# Patient Record
Sex: Female | Born: 1995 | Hispanic: Yes | Marital: Married | Smoking: Never smoker
Health system: Southern US, Community
[De-identification: ages and names within clinical notes are randomized; demographics above are authoritative.]

## PROBLEM LIST (undated history)

## (undated) DIAGNOSIS — Z789 Other specified health status: Secondary | ICD-10-CM

## (undated) HISTORY — PX: NO PAST SURGERIES: SHX2092

---

## 2018-04-29 NOTE — L&D Delivery Note (Signed)
Delivery Note  First Stage: Labor onset: 1939 Induction: Cytotec x 4doses, Cook cath, Pitocin Analgesia /Anesthesia intrapartum: epidural SROM at 1939  Second Stage: Complete dilation at 0140 Onset of pushing at 0148 FHR second stage Cat II- variable decels with terminal bradycardia x 2.23min to 65bpm.   Delivery of a viable female infant on 11/25/18 at [facility] by CNM.  delivery of fetal head in OA position with restitution to LOA. No nuchal cord;  Anterior then posterior shoulders delivered easily with gentle downward traction and tight body cord noted after delivery of posterior shoulder and infant chest. Baby placed on mom's chest, but not vigorous after 30sec stim, cord double clamped and cut by FOB. Infant moved to warmer for attendance by nursery team for transition.  Arterial cord blood sample collection attempted, venous sample obtained. Cord blood collected due to maternal blood type O Pos.   Third Stage: Placenta delivered spontaneously intact with 3VC @ 0204 Placenta disposition: to pathology due to suspected FGR, CHTN on medications. Intact with adherent clots and calicifications noted.  Uterine tone Firm / bleeding scant  No cervical, vaginal or perineal lacerations identified  Anesthesia for repair: epidural Repair n/a Est. Blood Loss (mL): 465KC  Complications: none  Mom to postpartum.  Baby to Couplet care / Skin to Skin.  Newborn: Birth Weight: 6#7  Apgar Scores: 7/9 Feeding planned: breast

## 2018-07-10 DIAGNOSIS — O10919 Unspecified pre-existing hypertension complicating pregnancy, unspecified trimester: Secondary | ICD-10-CM | POA: Diagnosis not present

## 2018-07-10 DIAGNOSIS — Z6841 Body Mass Index (BMI) 40.0 and over, adult: Secondary | ICD-10-CM | POA: Diagnosis not present

## 2018-07-10 DIAGNOSIS — O0992 Supervision of high risk pregnancy, unspecified, second trimester: Secondary | ICD-10-CM | POA: Diagnosis not present

## 2018-07-10 LAB — OB RESULTS CONSOLE HEPATITIS B SURFACE ANTIGEN: Hepatitis B Surface Ag: NEGATIVE

## 2018-07-10 LAB — OB RESULTS CONSOLE RUBELLA ANTIBODY, IGM: Rubella: IMMUNE

## 2018-07-10 LAB — OB RESULTS CONSOLE HIV ANTIBODY (ROUTINE TESTING): HIV: NONREACTIVE

## 2018-07-10 LAB — OB RESULTS CONSOLE RPR: RPR: NONREACTIVE

## 2018-07-10 LAB — OB RESULTS CONSOLE VARICELLA ZOSTER ANTIBODY, IGG: Varicella: IMMUNE

## 2018-07-13 DIAGNOSIS — Z6841 Body Mass Index (BMI) 40.0 and over, adult: Secondary | ICD-10-CM | POA: Diagnosis not present

## 2018-07-13 DIAGNOSIS — O0992 Supervision of high risk pregnancy, unspecified, second trimester: Secondary | ICD-10-CM | POA: Diagnosis not present

## 2018-08-11 DIAGNOSIS — Z113 Encounter for screening for infections with a predominantly sexual mode of transmission: Secondary | ICD-10-CM | POA: Diagnosis not present

## 2018-08-11 DIAGNOSIS — Z8619 Personal history of other infectious and parasitic diseases: Secondary | ICD-10-CM | POA: Diagnosis not present

## 2018-08-12 DIAGNOSIS — O0992 Supervision of high risk pregnancy, unspecified, second trimester: Secondary | ICD-10-CM | POA: Diagnosis not present

## 2018-09-17 DIAGNOSIS — O0992 Supervision of high risk pregnancy, unspecified, second trimester: Secondary | ICD-10-CM | POA: Diagnosis not present

## 2018-09-17 DIAGNOSIS — O10912 Unspecified pre-existing hypertension complicating pregnancy, second trimester: Secondary | ICD-10-CM | POA: Diagnosis not present

## 2018-10-05 DIAGNOSIS — O0993 Supervision of high risk pregnancy, unspecified, third trimester: Secondary | ICD-10-CM | POA: Diagnosis not present

## 2018-10-05 DIAGNOSIS — O365930 Maternal care for other known or suspected poor fetal growth, third trimester, not applicable or unspecified: Secondary | ICD-10-CM | POA: Diagnosis not present

## 2018-10-05 DIAGNOSIS — Z23 Encounter for immunization: Secondary | ICD-10-CM | POA: Diagnosis not present

## 2018-10-05 DIAGNOSIS — O10913 Unspecified pre-existing hypertension complicating pregnancy, third trimester: Secondary | ICD-10-CM | POA: Diagnosis not present

## 2018-10-07 ENCOUNTER — Other Ambulatory Visit: Payer: Self-pay | Admitting: Obstetrics & Gynecology

## 2018-10-07 DIAGNOSIS — O365931 Maternal care for other known or suspected poor fetal growth, third trimester, fetus 1: Secondary | ICD-10-CM

## 2018-10-08 ENCOUNTER — Other Ambulatory Visit: Payer: Self-pay

## 2018-10-08 DIAGNOSIS — O365990 Maternal care for other known or suspected poor fetal growth, unspecified trimester, not applicable or unspecified: Secondary | ICD-10-CM

## 2018-10-12 ENCOUNTER — Ambulatory Visit
Admission: RE | Admit: 2018-10-12 | Discharge: 2018-10-12 | Disposition: A | Discharge status: Home / Self Care | Payer: Medicaid Other | Source: Ambulatory Visit | Attending: Maternal & Fetal Medicine | Admitting: Maternal & Fetal Medicine

## 2018-10-12 ENCOUNTER — Other Ambulatory Visit: Payer: Self-pay | Admitting: Maternal & Fetal Medicine

## 2018-10-12 ENCOUNTER — Other Ambulatory Visit: Payer: Self-pay

## 2018-10-12 DIAGNOSIS — Z79899 Other long term (current) drug therapy: Secondary | ICD-10-CM | POA: Diagnosis not present

## 2018-10-12 DIAGNOSIS — O365990 Maternal care for other known or suspected poor fetal growth, unspecified trimester, not applicable or unspecified: Secondary | ICD-10-CM

## 2018-10-12 DIAGNOSIS — O10913 Unspecified pre-existing hypertension complicating pregnancy, third trimester: Secondary | ICD-10-CM | POA: Diagnosis not present

## 2018-10-12 DIAGNOSIS — O321XX0 Maternal care for breech presentation, not applicable or unspecified: Secondary | ICD-10-CM | POA: Diagnosis not present

## 2018-10-12 DIAGNOSIS — O365930 Maternal care for other known or suspected poor fetal growth, third trimester, not applicable or unspecified: Secondary | ICD-10-CM | POA: Diagnosis not present

## 2018-10-12 DIAGNOSIS — Z3A31 31 weeks gestation of pregnancy: Secondary | ICD-10-CM | POA: Insufficient documentation

## 2018-10-15 ENCOUNTER — Other Ambulatory Visit: Payer: Self-pay

## 2018-10-15 DIAGNOSIS — O365990 Maternal care for other known or suspected poor fetal growth, unspecified trimester, not applicable or unspecified: Secondary | ICD-10-CM

## 2018-10-19 ENCOUNTER — Other Ambulatory Visit: Payer: Self-pay

## 2018-10-19 ENCOUNTER — Ambulatory Visit
Admission: RE | Admit: 2018-10-19 | Discharge: 2018-10-19 | Disposition: A | Discharge status: Home / Self Care | Payer: Medicaid Other | Source: Ambulatory Visit | Attending: Obstetrics and Gynecology | Admitting: Obstetrics and Gynecology

## 2018-10-19 DIAGNOSIS — Z3A32 32 weeks gestation of pregnancy: Secondary | ICD-10-CM | POA: Diagnosis not present

## 2018-10-19 DIAGNOSIS — O365930 Maternal care for other known or suspected poor fetal growth, third trimester, not applicable or unspecified: Secondary | ICD-10-CM | POA: Insufficient documentation

## 2018-10-19 DIAGNOSIS — O321XX0 Maternal care for breech presentation, not applicable or unspecified: Secondary | ICD-10-CM | POA: Diagnosis not present

## 2018-10-19 DIAGNOSIS — O365990 Maternal care for other known or suspected poor fetal growth, unspecified trimester, not applicable or unspecified: Secondary | ICD-10-CM

## 2018-10-22 ENCOUNTER — Other Ambulatory Visit: Payer: Self-pay

## 2018-10-22 ENCOUNTER — Ambulatory Visit: Payer: Medicaid Other

## 2018-10-22 DIAGNOSIS — O365990 Maternal care for other known or suspected poor fetal growth, unspecified trimester, not applicable or unspecified: Secondary | ICD-10-CM

## 2018-10-26 ENCOUNTER — Other Ambulatory Visit: Payer: Self-pay

## 2018-10-26 ENCOUNTER — Ambulatory Visit
Admission: RE | Admit: 2018-10-26 | Discharge: 2018-10-26 | Disposition: A | Discharge status: Home / Self Care | Payer: Medicaid Other | Source: Ambulatory Visit | Attending: Obstetrics and Gynecology | Admitting: Obstetrics and Gynecology

## 2018-10-26 DIAGNOSIS — O365990 Maternal care for other known or suspected poor fetal growth, unspecified trimester, not applicable or unspecified: Secondary | ICD-10-CM

## 2018-10-26 DIAGNOSIS — O321XX0 Maternal care for breech presentation, not applicable or unspecified: Secondary | ICD-10-CM

## 2018-10-26 DIAGNOSIS — O365930 Maternal care for other known or suspected poor fetal growth, third trimester, not applicable or unspecified: Secondary | ICD-10-CM | POA: Insufficient documentation

## 2018-10-26 DIAGNOSIS — O169 Unspecified maternal hypertension, unspecified trimester: Secondary | ICD-10-CM

## 2018-10-26 DIAGNOSIS — Z3A33 33 weeks gestation of pregnancy: Secondary | ICD-10-CM | POA: Insufficient documentation

## 2018-10-26 DIAGNOSIS — O163 Unspecified maternal hypertension, third trimester: Secondary | ICD-10-CM | POA: Insufficient documentation

## 2018-10-26 DIAGNOSIS — O10913 Unspecified pre-existing hypertension complicating pregnancy, third trimester: Secondary | ICD-10-CM | POA: Diagnosis not present

## 2018-10-26 DIAGNOSIS — O0993 Supervision of high risk pregnancy, unspecified, third trimester: Secondary | ICD-10-CM | POA: Diagnosis not present

## 2018-10-26 DIAGNOSIS — O99210 Obesity complicating pregnancy, unspecified trimester: Secondary | ICD-10-CM | POA: Insufficient documentation

## 2018-10-26 DIAGNOSIS — O10019 Pre-existing essential hypertension complicating pregnancy, unspecified trimester: Secondary | ICD-10-CM

## 2018-10-26 HISTORY — DX: Maternal care for other known or suspected poor fetal growth, unspecified trimester, not applicable or unspecified: O36.5990

## 2018-10-26 HISTORY — DX: Maternal care for breech presentation, not applicable or unspecified: O32.1XX0

## 2018-10-26 HISTORY — DX: Unspecified maternal hypertension, unspecified trimester: O16.9

## 2018-10-26 NOTE — Progress Notes (Signed)
Patient is a 23 year old G1, P0 white female with hypertension on meds labetalol 400 twice daily, obesity BMI 45, fetal growth restriction 6 percentile BPP today was 8 out of 8 Dopplers were normal range fluid was normal fetus was breech Blood pressure at presentation was 143/90 patient reported she had just taken her blood pressure medicine on the way to the clinic Repeat after her scan was 128/80 using a large cuff (maroon) I advised that the patient return weekly for BPP Dopplers and fluid check At 36 weeks consider going to twice weekly given her hypertension on meds (this could be an additional NST or another BPP) Previously I recommended consideration of delivery at 39 weeks given the fetus was greater than 5th percentile but adding in hypertension on meds consideration could be given to moving the delivery earlier. Patient was scheduled for follow-up appointment in 1 week. We will continue to do every 3 week growth scans.  Sandy Sparks

## 2018-10-29 ENCOUNTER — Other Ambulatory Visit: Payer: Self-pay

## 2018-10-29 DIAGNOSIS — O0993 Supervision of high risk pregnancy, unspecified, third trimester: Secondary | ICD-10-CM | POA: Diagnosis not present

## 2018-10-29 DIAGNOSIS — O10913 Unspecified pre-existing hypertension complicating pregnancy, third trimester: Secondary | ICD-10-CM | POA: Diagnosis not present

## 2018-10-29 DIAGNOSIS — O365990 Maternal care for other known or suspected poor fetal growth, unspecified trimester, not applicable or unspecified: Secondary | ICD-10-CM

## 2018-11-02 ENCOUNTER — Observation Stay
Admission: RE | Admit: 2018-11-02 | Discharge: 2018-11-02 | Disposition: A | Discharge status: Home / Self Care | Payer: Medicaid Other | Attending: Obstetrics and Gynecology | Admitting: Obstetrics and Gynecology

## 2018-11-02 ENCOUNTER — Ambulatory Visit
Admission: RE | Admit: 2018-11-02 | Discharge: 2018-11-02 | Disposition: A | Discharge status: Home / Self Care | Payer: Medicaid Other | Source: Ambulatory Visit | Attending: Obstetrics and Gynecology | Admitting: Obstetrics and Gynecology

## 2018-11-02 ENCOUNTER — Ambulatory Visit
Admission: RE | Admit: 2018-11-02 | Discharge: 2018-11-02 | Disposition: A | Discharge status: Home / Self Care | Payer: Medicaid Other | Source: Ambulatory Visit

## 2018-11-02 ENCOUNTER — Other Ambulatory Visit: Payer: Self-pay

## 2018-11-02 DIAGNOSIS — Z3A33 33 weeks gestation of pregnancy: Secondary | ICD-10-CM | POA: Diagnosis not present

## 2018-11-02 DIAGNOSIS — O10013 Pre-existing essential hypertension complicating pregnancy, third trimester: Secondary | ICD-10-CM | POA: Diagnosis not present

## 2018-11-02 DIAGNOSIS — O365930 Maternal care for other known or suspected poor fetal growth, third trimester, not applicable or unspecified: Secondary | ICD-10-CM | POA: Diagnosis not present

## 2018-11-02 DIAGNOSIS — G5600 Carpal tunnel syndrome, unspecified upper limb: Secondary | ICD-10-CM | POA: Insufficient documentation

## 2018-11-02 DIAGNOSIS — O10913 Unspecified pre-existing hypertension complicating pregnancy, third trimester: Secondary | ICD-10-CM | POA: Diagnosis not present

## 2018-11-02 DIAGNOSIS — O368330 Maternal care for abnormalities of the fetal heart rate or rhythm, third trimester, not applicable or unspecified: Secondary | ICD-10-CM | POA: Diagnosis not present

## 2018-11-02 DIAGNOSIS — G5603 Carpal tunnel syndrome, bilateral upper limbs: Secondary | ICD-10-CM

## 2018-11-02 DIAGNOSIS — O365990 Maternal care for other known or suspected poor fetal growth, unspecified trimester, not applicable or unspecified: Secondary | ICD-10-CM

## 2018-11-02 DIAGNOSIS — O321XX0 Maternal care for breech presentation, not applicable or unspecified: Secondary | ICD-10-CM

## 2018-11-02 HISTORY — DX: Other specified health status: Z78.9

## 2018-11-02 NOTE — OB Triage Note (Signed)
Provider reviewed strip. D/c instructions reviewed with patient and patient verbalized understanding of instructions. Pt has no further questions at this time and discharged in stable condition.

## 2018-11-02 NOTE — Progress Notes (Signed)
   Triage visit for NST   Sandy Sparks is a 23 y.o. G1P0. She is at [redacted]w[redacted]d gestation. She presents for a unscheduled NST, sent up from [facility] for monitoring. Hx of HTN on labetalol 400mg  BID and fetal growth restriction 6%.  Indication: BPP 8/8 but non-reactive NST  O:  BP 121/68   Pulse 95   Temp 99.1 F (37.3 C) (Oral)   Resp 18   Ht 5\' 6"  (1.676 m)   Wt 128.8 kg   LMP 03/06/2018   BMI 45.84 kg/m  No results found for this or any previous visit (from the past 48 hour(s)).   Gen: NAD, AAOx3      Abd: FNTTP      Ext: Non-tender, Nonedmeatous    NST  Baseline: 160 Variability: moderate Accelerations present x >2 Decelerations absent Time 80mins  Interpretation: reactive NST, category 1 tracing  A/P:  23 y.o. G1P0 [redacted]w[redacted]d with HTN.    Reactive NST, with moderate variability and accelerations, no decels  Fetal Wellbeing: Reassuring  D/c home stable, precautions reviewed, follow-up as scheduled.     ----- Benjaman Kindler, MD MPH Attending Obstetrician and Gynecologist [facility], Department of [facility]

## 2018-11-02 NOTE — OB Triage Note (Signed)
Pt brought up from [facility] for NST due to non reactive NST downstairs. Monitors applied and initial FHT 160.

## 2018-11-02 NOTE — Progress Notes (Signed)
Pt had [place] at [facility] today for Growth,  BPP and Dopplers.  NST performed after because FHT 160's-170's. NST Non-Reactive.  Dr. Lehman Prom wanted pt brought up to [facility] #3 for monitoring.  2 BP's taken in [facility]-157/99 on arrival and 135/76 during [place].  Pt took BP meds at 1030 today.Report Given to Assunta Curtis, Therapist, sports.

## 2018-11-02 NOTE — Discharge Summary (Addendum)
Discharged from triage. 

## 2018-11-05 DIAGNOSIS — O10913 Unspecified pre-existing hypertension complicating pregnancy, third trimester: Secondary | ICD-10-CM | POA: Diagnosis not present

## 2018-11-05 DIAGNOSIS — O365930 Maternal care for other known or suspected poor fetal growth, third trimester, not applicable or unspecified: Secondary | ICD-10-CM | POA: Diagnosis not present

## 2018-11-05 DIAGNOSIS — O0993 Supervision of high risk pregnancy, unspecified, third trimester: Secondary | ICD-10-CM | POA: Diagnosis not present

## 2018-11-09 ENCOUNTER — Ambulatory Visit
Admission: RE | Admit: 2018-11-09 | Discharge: 2018-11-09 | Disposition: A | Discharge status: Home / Self Care | Payer: Medicaid Other | Source: Ambulatory Visit | Attending: Maternal & Fetal Medicine | Admitting: Maternal & Fetal Medicine

## 2018-11-09 ENCOUNTER — Other Ambulatory Visit: Payer: Self-pay | Admitting: Maternal & Fetal Medicine

## 2018-11-09 ENCOUNTER — Other Ambulatory Visit: Payer: Self-pay

## 2018-11-09 DIAGNOSIS — O365990 Maternal care for other known or suspected poor fetal growth, unspecified trimester, not applicable or unspecified: Secondary | ICD-10-CM

## 2018-11-09 DIAGNOSIS — Z3A35 35 weeks gestation of pregnancy: Secondary | ICD-10-CM | POA: Diagnosis not present

## 2018-11-09 DIAGNOSIS — O365930 Maternal care for other known or suspected poor fetal growth, third trimester, not applicable or unspecified: Secondary | ICD-10-CM | POA: Insufficient documentation

## 2018-11-12 ENCOUNTER — Other Ambulatory Visit: Payer: Self-pay | Admitting: Maternal & Fetal Medicine

## 2018-11-12 DIAGNOSIS — O163 Unspecified maternal hypertension, third trimester: Secondary | ICD-10-CM

## 2018-11-13 DIAGNOSIS — O0993 Supervision of high risk pregnancy, unspecified, third trimester: Secondary | ICD-10-CM | POA: Diagnosis not present

## 2018-11-13 DIAGNOSIS — O321XX0 Maternal care for breech presentation, not applicable or unspecified: Secondary | ICD-10-CM | POA: Diagnosis not present

## 2018-11-13 DIAGNOSIS — O365930 Maternal care for other known or suspected poor fetal growth, third trimester, not applicable or unspecified: Secondary | ICD-10-CM | POA: Diagnosis not present

## 2018-11-13 LAB — OB RESULTS CONSOLE GC/CHLAMYDIA
Chlamydia: NEGATIVE
Gonorrhea: NEGATIVE

## 2018-11-13 LAB — OB RESULTS CONSOLE GBS: GBS: NEGATIVE

## 2018-11-16 ENCOUNTER — Ambulatory Visit
Admission: RE | Admit: 2018-11-16 | Discharge: 2018-11-16 | Disposition: A | Discharge status: Home / Self Care | Payer: Medicaid Other | Source: Ambulatory Visit | Attending: Maternal & Fetal Medicine | Admitting: Maternal & Fetal Medicine

## 2018-11-16 DIAGNOSIS — O163 Unspecified maternal hypertension, third trimester: Secondary | ICD-10-CM | POA: Diagnosis not present

## 2018-11-16 DIAGNOSIS — Z3A36 36 weeks gestation of pregnancy: Secondary | ICD-10-CM | POA: Insufficient documentation

## 2018-11-18 ENCOUNTER — Ambulatory Visit
Admission: RE | Admit: 2018-11-18 | Discharge: 2018-11-18 | Disposition: A | Discharge status: Home / Self Care | Payer: Medicaid Other | Source: Ambulatory Visit | Attending: Obstetrics and Gynecology | Admitting: Obstetrics and Gynecology

## 2018-11-18 ENCOUNTER — Other Ambulatory Visit: Payer: Self-pay

## 2018-11-18 DIAGNOSIS — Z1159 Encounter for screening for other viral diseases: Secondary | ICD-10-CM | POA: Diagnosis not present

## 2018-11-18 LAB — SARS CORONAVIRUS 2 (TAT 6-24 HRS): SARS Coronavirus 2: NEGATIVE

## 2018-11-20 ENCOUNTER — Inpatient Hospital Stay
Admission: EM | Admit: 2018-11-20 | Discharge: 2018-11-20 | DRG: 832 | Disposition: A | Discharge status: Home / Self Care | Payer: Medicaid Other | Attending: Obstetrics and Gynecology | Admitting: Obstetrics and Gynecology

## 2018-11-20 ENCOUNTER — Other Ambulatory Visit: Payer: Self-pay

## 2018-11-20 DIAGNOSIS — O321XX0 Maternal care for breech presentation, not applicable or unspecified: Secondary | ICD-10-CM | POA: Diagnosis present

## 2018-11-20 DIAGNOSIS — O365930 Maternal care for other known or suspected poor fetal growth, third trimester, not applicable or unspecified: Secondary | ICD-10-CM | POA: Diagnosis not present

## 2018-11-20 DIAGNOSIS — D649 Anemia, unspecified: Secondary | ICD-10-CM | POA: Diagnosis present

## 2018-11-20 DIAGNOSIS — O368330 Maternal care for abnormalities of the fetal heart rate or rhythm, third trimester, not applicable or unspecified: Secondary | ICD-10-CM | POA: Diagnosis not present

## 2018-11-20 DIAGNOSIS — O0993 Supervision of high risk pregnancy, unspecified, third trimester: Secondary | ICD-10-CM | POA: Diagnosis not present

## 2018-11-20 DIAGNOSIS — O365990 Maternal care for other known or suspected poor fetal growth, unspecified trimester, not applicable or unspecified: Secondary | ICD-10-CM | POA: Diagnosis present

## 2018-11-20 DIAGNOSIS — O10913 Unspecified pre-existing hypertension complicating pregnancy, third trimester: Secondary | ICD-10-CM | POA: Diagnosis not present

## 2018-11-20 DIAGNOSIS — O99013 Anemia complicating pregnancy, third trimester: Secondary | ICD-10-CM | POA: Diagnosis present

## 2018-11-20 DIAGNOSIS — Z3A37 37 weeks gestation of pregnancy: Secondary | ICD-10-CM

## 2018-11-20 DIAGNOSIS — O10919 Unspecified pre-existing hypertension complicating pregnancy, unspecified trimester: Secondary | ICD-10-CM

## 2018-11-20 DIAGNOSIS — O10013 Pre-existing essential hypertension complicating pregnancy, third trimester: Secondary | ICD-10-CM | POA: Diagnosis present

## 2018-11-20 LAB — CBC
HCT: 33.7 % — ABNORMAL LOW (ref 36.0–46.0)
Hemoglobin: 11.2 g/dL — ABNORMAL LOW (ref 12.0–15.0)
MCH: 27.5 pg (ref 26.0–34.0)
MCHC: 33.2 g/dL (ref 30.0–36.0)
MCV: 82.6 fL (ref 80.0–100.0)
Platelets: 319 10*3/uL (ref 150–400)
RBC: 4.08 MIL/uL (ref 3.87–5.11)
RDW: 14.2 % (ref 11.5–15.5)
WBC: 10.8 10*3/uL — ABNORMAL HIGH (ref 4.0–10.5)
nRBC: 0.2 % (ref 0.0–0.2)

## 2018-11-20 LAB — BASIC METABOLIC PANEL
Anion gap: 8 (ref 5–15)
BUN: 6 mg/dL (ref 6–20)
CO2: 20 mmol/L — ABNORMAL LOW (ref 22–32)
Calcium: 9.2 mg/dL (ref 8.9–10.3)
Chloride: 107 mmol/L (ref 98–111)
Creatinine, Ser: 0.48 mg/dL (ref 0.44–1.00)
GFR calc Af Amer: >60 mL/min (ref >60)
GFR calc non Af Amer: >60 mL/min (ref >60)
Glucose, Bld: 93 mg/dL (ref 70–99)
Potassium: 4 mmol/L (ref 3.5–5.1)
Sodium: 135 mmol/L (ref 135–145)

## 2018-11-20 LAB — TYPE AND SCREEN
ABO/RH(D): O POS
Antibody Screen: NEGATIVE

## 2018-11-20 LAB — ABO/RH: ABO/RH(D): O POS

## 2018-11-20 MED ORDER — OXYTOCIN 40 UNITS IN NORMAL SALINE INFUSION - SIMPLE MED
INTRAVENOUS | Status: AC
  Filled 2018-11-20: qty 1000

## 2018-11-20 MED ORDER — LACTATED RINGERS IV SOLN
INTRAVENOUS | Status: DC
  Administered 2018-11-20: 13:00:00 via INTRAVENOUS

## 2018-11-20 MED ORDER — SOD CITRATE-CITRIC ACID 500-334 MG/5ML PO SOLN
ORAL | Status: AC
  Filled 2018-11-20: qty 30

## 2018-11-20 NOTE — H&P (Signed)
OB ADMISSION/ HISTORY & PHYSICAL:  Admission Date: 11/20/2018 10:11 AM  Admit Diagnosis: Fetal tachycardia  Virginia Curl is a 23 y.o. G1P0 at 37+0 wks (by LMP of 03/06/18 and confirmed with her first ultrasound at 17wks5days) presenting for fetal tachycardia in the office of 170s and confirmed in triage.  She has chronic HTN with severe range BP at initial OB visit, is on labetalol, with fetal growth restriction and normal Dopplers, and breech presentation.  Prenatal History: G1P0   EDC : 12/17/2018, by Patient Reported  Prenatal care at [facility] Prenatal course complicated by  - breech -cHTN, on ASA, labetalol 400mg  BID -FGR with EFW 6% and AC of 3%, normal Doppler's with MFM -Anemia -Prepregnancy BMI 41  BREECH persistent since 21wks--counseled ECV vs LTSC 1. Late to care at 18w due to insurance 2. Chronic hypertension  BPs 174/92 and 162/86 at initial OB visit  [x]  Baseline labs (CBC, CMP, urine Pr:Cr): ordered 07/10/2018  [x]  Baby ASA prescribed after 12 weeks: advised to start 07/10/2018, rx sent to pharmacy  [ ]  Serial growth scans -35-38  [ ]  Antenatal testing starting at 32 weeks  MFM 6/15: Rec weekly BPP and UA dopplers for IUGR 6%, repeat growth 3 weeks, delivery at 39 weeks    [ ]  Delivery by 39 weeks (with meds 37wks if not controlled, 40 with no meds) or earlier if needed  Labetalol 200mg  BID started 07/10/2018 per CCW  Increased to 400mg  BID on 08/11/2018 per BEB 3. Anemia,   initial Hgb 10.6 at NOB, to start on iron with Vit C.  4. Prepregnancy BMI 41  Advised TWG 11-20 pounds  CMP-wnl, P/C ratio: 60, and a1c: 5.7 07/10/2018  Early GTT: 94 5. Fetal Growth Restriction: EFW 6% at MFM on 6/15  Weekly Dopplers and BPP at MFM  Weekly NST  Repeat growth at MFM-7/6: EFW 8% with AC 3%, rec. delivery 37-38wks  Patient requests aversion  6. Chlamydia POSITIVE at NOB  Treated 03/19 ET  TOC at next visit 04/14 with [facility]  TOC Results: Negative   Medical /  Surgical History :  Past medical history:  Past Medical History:  Diagnosis Date  . Medical history non-contributory      Past surgical history:  Past Surgical History:  Procedure Laterality Date  . NO PAST SURGERIES      Family History:  Family History  Problem Relation Age of Onset  . Aneurysm Mother        Brain  . Heart murmur Brother   . Aneurysm Maternal Grandmother   . Stroke Maternal Grandmother   . Diabetes Paternal Grandmother      Social History:  reports that she has never smoked. She has never used smokeless tobacco. She reports that she does not drink alcohol or use drugs.   Allergies: Patient has no known allergies.    Current Medications at time of admission:  Prior to Admission medications   Medication Sig Start Date End Date Taking? Authorizing Provider  aspirin EC 81 MG tablet Take 81 mg by mouth daily.   Yes [provider]  ferrous sulfate 325 (65 FE) MG tablet Take 650 mg by mouth every evening.    Yes [provider]  labetalol (NORMODYNE) 200 MG tablet Take 400 mg by mouth 2 (two) times daily.    Yes [provider]  Prenatal Vit-Fe Fumarate-FA (PRENATAL MULTIVITAMIN) TABS tablet Take 1 tablet by mouth every evening.    Yes [provider]  vitamin C (  ASCORBIC ACID) 250 MG tablet Take 250 mg by mouth every evening.    Yes [provider]     Review of Systems: Active FM No HA, edema, RUQ pain or visual changes   Physical Exam:  VS: Blood pressure (!) 143/78, pulse 97, temperature 98.6 F (37 C), temperature source Oral, resp. rate 17, height 5\' 6"  (1.676 m), weight 132.5 kg, last menstrual period 03/06/2018.  General: alert and oriented, appears NAD Heart: RRR Lungs: Clear lung fields Abdomen: Gravid, soft and non-tender, non-distended Extremities: trace edema  FHT: 170, moderate variability, +accels, no decels TOCO: q10 min SVE:  deferred   Cephalic by leopolds  Prenatal Labs: Blood  type/Rh  O  Antibody screen neg  Rubella Immune  Varicella Immune  RPR NR  HBsAg Neg  HIV NR  GC neg  Chlamydia neg  Genetic screening negative  1 hour GTT 102  3 hour GTT n/a  GBS neg   [place] Mfm Fetal Bpp W/nonstress  Result Date: 11/09/2018 ----------------------------------------------------------------------  OBSTETRICS REPORT                       (Signed Final 11/02/2018 12:37 pm) ---------------------------------------------------------------------- PATIENT INFO:  ID #:       [id number]                          D.O.B.:  02-26-1996 (23 yrs)  Name:       Lorenda PeckKATHERINE Trupiano                 Visit Date: 11/02/2018 11:55 am ---------------------------------------------------------------------- PERFORMED BY:  Performed By:     Verne Grainonna Moody            Ref. Address:     [address]                    Sonographer                                                             [address], [place],                                                             [place] [id number]  Referred By:      Leola BrazilHELSEA C                    WARD MD ---------------------------------------------------------------------- SERVICE(S) PROVIDED:   US MFM OB FOLLOW UP                                  [phone]  ---------------------------------------------------------------------- INDICATIONS:   [redacted] weeks gestation of pregnancy                Z3A.34  ---------------------------------------------------------------------- FETAL EVALUATION:  Num Of Fetuses:         1  Fetal Heart Rate(bpm):  169  Cardiac Activity:       Present  Fetal Lie:  Maternal Right  Presentation:           Breech  Placenta:               Posterior right, fundal  Amniotic Fluid  AFI FV:      Within normal limits  AFI Sum(cm)     %Tile       Largest Pocket(cm)  14.87           53          5.6  RUQ(cm)       RLQ(cm)       LUQ(cm)        LLQ(cm)  1.82          3.25          4.2            5.6 ---------------------------------------------------------------------- BIOMETRY:   BPD:      77.4  mm     G. Age:  31w 0d        < 1  %    CI:        69.25   %    70 - 86                                                          FL/HC:      22.3   %    19.4 - 21.8  HC:       297   mm     G. Age:  32w 6d        < 3  %    HC/AC:      1.07        0.96 - 1.11  AC:      278.1  mm     G. Age:  31w 6d          3  %    FL/BPD:     85.5   %    71 - 87  FL:       66.2  mm     G. Age:  34w 1d         32  %    FL/AC:      23.8   %    20 - 24  Est. FW:    2002  gm      4 lb 7 oz      8  % ---------------------------------------------------------------------- OB HISTORY:  Gravidity:    1 ---------------------------------------------------------------------- GESTATIONAL AGE:  LMP:           34w 3d        Date:  03/06/18                 EDD:   12/11/18  U/S Today:     32w 3d                                        EDD:   12/25/18  Best:          34w 3d     Det. By:  LMP  (03/06/18)          EDD:   12/11/18 ---------------------------------------------------------------------- ANATOMY:  Cranium:  Within Normal Limits   Diaphragm:              Within Normal Limits  Cavum:                 Normal appearance      Stomach:                Seen  Ventricles:            Normal appearance      Kidneys:                Normal appearance  Thoracic:              Normal appearance      Bladder:                Seen  Heart:                 Normal 4 chamber       Spine:                  Normal appearance                         view ---------------------------------------------------------------------- DOPPLER - FETAL VESSELS:  Umbilical Artery   S/D     %tile   2.4       44 ---------------------------------------------------------------------- IMPRESSION:  ,  Thank you for referring your patient for a fetal growth  evaluation for FGR and Hypertension .  There is a singleton gestation with normal amniotic fluid  volume.  Fetus is BREECH  The pt is at 2234 w 3d by her LMP and [facility]  on 17w 5d on  07/14/2018.  The estimated fetal weight is at the 8th   percentile.  (  previously at the 6th percentile)  However, the abdominal circumference is less than the 3rd  percentile.  This finding could be a sign of intrauterine growth restriction  (IUGR).  It must be noted that estimated fetal weight by ultrasound and  actual birthweight can differ up to 15 percent.  The BPP was noted to be 8/8 which was reassuring.  FHR noted to be in the 160s and the patient was placed on  the monitor for a NST.  The umbilical artery Doppler studies were within normal limits  (S/D = 2.4)  Weekly testing (BPP and Doppler studies) is suggested.  Serial growth scans are suggested (approximately every 3  weeks).  Given her history of HTN on labetalol and FGR, Consider  early term delivery at 37-38 weeks .  No absolute contraindications to external cephalic version,  (normal fluid and posterior placenta ).  Her BP was initially elevated but improved after her  ultrasound.  Pt c/o hand pain consistent with carpal tunnel .  Thank you for allowing us to participate in your patient's care.  Please do not hesitate to contact us if we can be of further  assistance. ----------------------------------------------------------------------              Jimmey RalphElizabeth Livingston, MD Electronically Signed Final Report   11/02/2018 12:37 pm ----------------------------------------------------------------------  [place] Mfm Fetal Bpp Wo Non Stress  Result Date: 11/16/2018 ----------------------------------------------------------------------  OBSTETRICS REPORT                       (Signed Final 11/16/2018 12:27 pm) ---------------------------------------------------------------------- PATIENT INFO:  ID #:       [id number]  D.O.B.:  April 02, 1996 (23 yrs)  Name:       COLEEN CARDIFF                 Visit Date: 11/16/2018 12:11 pm ---------------------------------------------------------------------- PERFORMED BY:  Performed By:     Ceasar Mons          Ref. Address:     [address]                                                             [place], [place],                                                             [place] [address]  Referred By:      Leola Brazil MD ---------------------------------------------------------------------- SERVICE(S) PROVIDED:   [place] MFM FETAL BPP WO NON STRESS                       [phone]  ---------------------------------------------------------------------- INDICATIONS:   [redacted] weeks gestation of pregnancy                Z3A.36  ---------------------------------------------------------------------- FETAL EVALUATION:  Num Of Fetuses:         1  Fetal Heart Rate(bpm):  168  Cardiac Activity:       Present  Presentation:           Breech  Placenta:               Fundal  AFI Sum(cm)     %Tile       Largest Pocket(cm)  14.41           53          4.35  RUQ(cm)       RLQ(cm)       LUQ(cm)        LLQ(cm)  3.59          4.35          3.56           2.91 ---------------------------------------------------------------------- BIOPHYSICAL EVALUATION:  Amniotic F.V:   Within normal limits       F. Tone:        Observed  F. Movement:    Observed                   Score:          8/8  F. Breathing:   Observed ---------------------------------------------------------------------- OB HISTORY:  Gravidity:    1 ---------------------------------------------------------------------- GESTATIONAL AGE:  LMP:           36w 3d        Date:  03/06/18                 EDD:   12/11/18  Best:          Stevie Kern 3d  Det. By:  LMP  (03/06/18)          EDD:   12/11/18 ---------------------------------------------------------------------- DOPPLER - FETAL VESSELS:  Umbilical Artery   S/D     %tile  2.08       31 ---------------------------------------------------------------------- CERVIX UTERUS ADNEXA:  Cervix  Suboptimal ---------------------------------------------------------------------- IMPRESSION:  ,   Thank you for referring your patient for fetal biophysical  profile (BPP) and umbilical artery Dopplers due to IUGR (8th  percentile) and hypertension.  There is a singleton, BREECH gestation with normal amniotic  fluid volume.  The BPP was noted to be 8/8, which was reassuring.  The umbilical artery Doppler studies were within normal limits  for gestational age  She is scheduled for delivery next week.  Thank you for allowing [place] to participate in your patient's care.  Please do not hesitate to contact us if we can be of further  assistance. ----------------------------------------------------------------------                   Consuelo Pandy, MD Electronically Signed Final Report   11/16/2018 12:27 pm ----------------------------------------------------------------------  [place] Mfm Fetal Bpp Wo Non Stress  Result Date: 11/09/2018 ----------------------------------------------------------------------  OBSTETRICS REPORT                       (Signed Final 10/26/2018 10:48 am) ---------------------------------------------------------------------- PATIENT INFO:  ID #:       [id number]                          D.O.B.:  04/20/1996 (23 yrs)  Name:       Lorenda Peck                 Visit Date: 10/26/2018 10:07 am ---------------------------------------------------------------------- PERFORMED BY:  Performed By:     Ceasar Mons          Ref. Address:     [address]                                                             [place], [place],                                                             [place] [address]  Referred By:      Leola Brazil MD ---------------------------------------------------------------------- SERVICE(S) PROVIDED:   [place] MFM FETAL BPP WO NON STRESS                       [phone]  ---------------------------------------------------------------------- INDICATIONS:   [redacted] weeks gestation of pregnancy                Z3A.33   ---------------------------------------------------------------------- FETAL EVALUATION:  Num Of Fetuses:         1  Fetal Heart Rate(bpm):  168  Cardiac Activity:  Present  Presentation:           Breech  Placenta:               Posterior  AFI Sum(cm)     %Tile       Largest Pocket(cm)  12.97           40          3.44  RUQ(cm)       RLQ(cm)       LUQ(cm)        LLQ(cm)  3.44          3.23          3.29           3.01 ---------------------------------------------------------------------- BIOPHYSICAL EVALUATION:  Amniotic F.V:   Within normal limits       F. Tone:        Observed  F. Movement:    Observed                   Score:          8/8  F. Breathing:   Observed ---------------------------------------------------------------------- OB HISTORY:  Gravidity:    1 ---------------------------------------------------------------------- GESTATIONAL AGE:  LMP:           33w 3d        Date:  03/06/18                 EDD:   12/11/18  Best:          33w 3d     Det. By:  LMP  (03/06/18)          EDD:   12/11/18 ---------------------------------------------------------------------- ANATOMY:  Stomach:               Seen                   Bladder:                Seen ---------------------------------------------------------------------- DOPPLER - FETAL VESSELS:  Umbilical Artery   S/D     %tile     RI    %tile                     PSV                                                   (cm/s)   2.3       34   0.52        16                     51.7 ---------------------------------------------------------------------- CERVIX UTERUS ADNEXA:  Cervix  Length:            4.8  cm. ---------------------------------------------------------------------- IMPRESSION:  ,  Thank you for referring your patient  for fetal biophysical  profile (BPP) and umbilical artery Dopplers due to FGR (6th  %ile)and HTN on meds .Marland Kitchen  There is a singleton gestation with normal amniotic fluid  volume.  The pregnancy is at 33w 3d vased on LMP of 03/16/18  c/w  u/s performed at [facility] clinic on 07/14/18 at 17w 5d .  The BPP was noted to be 8/8, which was reassuring.  Fetus is BREECH  The umbilical artery Doppler studies were within normal limits  for  gestational age (S/D = 2.3 34 % ile )  Weekly testing (BPP and Doppler studies) is  suggested.Increase to twice weekly assessment with an  additional BPP or NST at 36 weeks.  Serial growth scans are suggested (approximately every 3  weeks).  OUr practice pattern is to deliver FGR fetuses >5th %ile at 39  weeks - given pt has HTN on meds could conisder moving  earlier .  Patient is on labetalol 400 bid initial BP was 143/90 pt  recently took meds - came down at end of visit using large  cuff 125/85 range  Thank you for allowing [place] to participate in your patient's care.  Please do not hesitate to contact us if we can be of further  assistance. ----------------------------------------------------------------------              Jimmey Ralph, MD Electronically Signed Final Report   10/26/2018 10:48 am ----------------------------------------------------------------------  [place] Mfm Ob Follow Up  Result Date: 11/09/2018 ----------------------------------------------------------------------  OBSTETRICS REPORT                       (Signed Final 11/02/2018 12:37 pm) ---------------------------------------------------------------------- PATIENT INFO:  ID #:       [id number]                          D.O.B.:  [birth year] (23 yrs)  Name:       ANICKA STUCKERT                 Visit Date: 11/02/2018 11:55 am ---------------------------------------------------------------------- PERFORMED BY:  Performed By:     Verne Grain            Ref. Address:     [address]                                                             [place], [place],                                                             [place] [address]  Referred By:      Leola Brazil MD  ---------------------------------------------------------------------- SERVICE(S) PROVIDED:   [place] MFM OB FOLLOW UP                                  [phone]  ---------------------------------------------------------------------- INDICATIONS:   [redacted] weeks gestation of pregnancy                Z3A.34  ---------------------------------------------------------------------- FETAL EVALUATION:  Num Of Fetuses:         1  Fetal Heart Rate(bpm):  169  Cardiac Activity:       Present  Fetal Lie:              Maternal Right  Presentation:           Breech  Placenta:               Posterior right, fundal  Amniotic Fluid  AFI FV:      Within normal limits  AFI Sum(cm)     %Tile       Largest Pocket(cm)  14.87           53          5.6  RUQ(cm)       RLQ(cm)       LUQ(cm)        LLQ(cm)  1.82          3.25          4.2            5.6 ---------------------------------------------------------------------- BIOMETRY:  BPD:      77.4  mm     G. Age:  31w 0d        < 1  %    CI:        69.25   %    70 - 86                                                          FL/HC:      22.3   %    19.4 - 21.8  HC:       297   mm     G. Age:  32w 6d        < 3  %    HC/AC:      1.07        0.96 - 1.11  AC:      278.1  mm     G. Age:  31w 6d          3  %    FL/BPD:     85.5   %    71 - 87  FL:       66.2  mm     G. Age:  34w 1d         32  %    FL/AC:      23.8   %    20 - 24  Est. FW:    2002  gm      4 lb 7 oz      8  % ---------------------------------------------------------------------- OB HISTORY:  Gravidity:    1 ---------------------------------------------------------------------- GESTATIONAL AGE:  LMP:           34w 3d        Date:  03/06/18                 EDD:   12/11/18  U/S Today:     32w 3d                                        EDD:   12/25/18  Best:          34w 3d     Det. By:  LMP  (03/06/18)          EDD:   12/11/18 ---------------------------------------------------------------------- ANATOMY:  Cranium:  Within Normal  Limits   Diaphragm:              Within Normal Limits  Cavum:                 Normal appearance      Stomach:                Seen  Ventricles:            Normal appearance      Kidneys:                Normal appearance  Thoracic:              Normal appearance      Bladder:                Seen  Heart:                 Normal 4 chamber       Spine:                  Normal appearance                         view ---------------------------------------------------------------------- DOPPLER - FETAL VESSELS:  Umbilical Artery   S/D     %tile   2.4       44 ---------------------------------------------------------------------- IMPRESSION:  ,  Thank you for referring your patient for a fetal growth  evaluation for FGR and Hypertension .  There is a singleton gestation with normal amniotic fluid  volume.  Fetus is BREECH  The pt is at 2634 w 3d by her LMP and [facility]  on 17w 5d on 07/14/2018.  The estimated fetal weight is at the 8th   percentile.  (  previously at the 6th percentile)  However, the abdominal circumference is less than the 3rd  percentile.  This finding could be a sign of intrauterine growth restriction  (IUGR).  It must be noted that estimated fetal weight by ultrasound and  actual birthweight can differ up to 15 percent.  The BPP was noted to be 8/8 which was reassuring.  FHR noted to be in the 160s and the patient was placed on  the monitor for a NST.  The umbilical artery Doppler studies were within normal limits  (S/D = 2.4)  Weekly testing (BPP and Doppler studies) is suggested.  Serial growth scans are suggested (approximately every 3  weeks).  Given her history of HTN on labetalol and FGR, Consider  early term delivery at 37-38 weeks .  No absolute contraindications to external cephalic version,  (normal fluid and posterior placenta ).  Her BP was initially elevated but improved after her  ultrasound.  Pt c/o hand pain consistent with carpal tunnel .  Thank you for allowing us to  participate in your patient's care.  Please do not hesitate to contact us if we can be of further  assistance. ----------------------------------------------------------------------              Jimmey RalphElizabeth Livingston, MD Electronically Signed Final Report   11/02/2018 12:37 pm ----------------------------------------------------------------------  [place] Mfm Ua Doppler Re-eval  Result Date: 11/09/2018 ----------------------------------------------------------------------  OBSTETRICS REPORT                       (Signed Final 11/09/2018 01:30 pm) ---------------------------------------------------------------------- PATIENT INFO:  ID #:       [id number]  D.O.B.:  10/30/1995 (23 yrs)  Name:       QUEENIE AUFIERO                 Visit Date: 11/09/2018 01:09 pm ---------------------------------------------------------------------- PERFORMED BY:  Performed By:     Neita Carp RDMS        Ref. Address:     [address], [place],                                                             [place] [address]  Referred By:      Leola Brazil MD ---------------------------------------------------------------------- SERVICE(S) PROVIDED:   [place] MFM UA DOPPLER RE-EVAL                            [phone]  ---------------------------------------------------------------------- INDICATIONS:   [redacted] weeks gestation of pregnancy                Z3A.35  ---------------------------------------------------------------------- FETAL EVALUATION:  Num Of Fetuses:         1  Fetal Heart Rate(bpm):  152  Cardiac Activity:       Present  Presentation:           Breech  Placenta:               Anterior Posterior right, fundal  AFI Sum(cm)     %Tile       Largest Pocket(cm)  16.42           60          6.86  RUQ(cm)       RLQ(cm)       LUQ(cm)        LLQ(cm)  4.83          3.04          1.69           6.86  ---------------------------------------------------------------------- BIOPHYSICAL EVALUATION:  Amniotic F.V:   Within normal limits       F. Tone:        Observed  F. Movement:    Observed                   Score:          8/8  F. Breathing:   Observed ---------------------------------------------------------------------- OB HISTORY:  Gravidity:    1 ---------------------------------------------------------------------- GESTATIONAL AGE:  LMP:           35w 3d        Date:  03/06/18                 EDD:   12/11/18  Best:          Consuello Closs 3d     Det. By:  LMP  (03/06/18)  EDD:   12/11/18 ---------------------------------------------------------------------- DOPPLER - FETAL VESSELS:  Umbilical Artery   S/D     %tile     RI    %tile                     PSV                                                   (cm/s)  2.96       75   0.66        81                     64.3 ---------------------------------------------------------------------- IMPRESSION:  Thank you for referring your patient for fetal biophysical  profile (BPP) and umbilical artery Dopplers due to fetal  growth restriction (efw at 8th percentile, however AC at 3rd  percentile, nl dopplers and AFI) due to matenral  hypertension.  BP today 136/77  There is a singleton gestation at 35 weeks 3 days with normal  amniotic fluid volume.  Dating is by LMP and earliest availabe  scan at [facility] on 07/14/18; measurements were consistent  with 17 weeks 5 days.  The fetus remains BREECH.  The BPP was noted to be 8/8, which was reassuring.  The umbilical artery Doppler studies were within normal limits  for gestational age.  Weekly testing (BPP and Doppler studies) is suggested.  Consider delivery 37-38 weeks due to HTN and FGR.  Thank you for allowing [place] to participate in your patient's care.  Please do not hesitate to contact us if we can be of further  assistance. ----------------------------------------------------------------------                   Consuelo Pandy, MD Electronically Signed Final Report   11/09/2018 01:30 pm ----------------------------------------------------------------------  [place] Mfm Ua Doppler Re-eval  Result Date: 11/09/2018 ----------------------------------------------------------------------  OBSTETRICS REPORT                       (Signed Final 11/02/2018 12:37 pm) ---------------------------------------------------------------------- PATIENT INFO:  ID #:       [id number]                          D.O.B.:  25-Mar-1996 (23 yrs)  Name:       AMBERLIE GAILLARD                 Visit Date: 11/02/2018 11:55 am ---------------------------------------------------------------------- PERFORMED BY:  Performed By:     Verne Grain            Ref. Address:     [address] [place],  [place] [address]  Referred By:      Leola Brazil MD ---------------------------------------------------------------------- SERVICE(S) PROVIDED:   [place] MFM OB FOLLOW UP                                  [phone]  ---------------------------------------------------------------------- INDICATIONS:   [redacted] weeks gestation of pregnancy                Z3A.34  ---------------------------------------------------------------------- FETAL EVALUATION:  Num Of Fetuses:         1  Fetal Heart Rate(bpm):  169  Cardiac Activity:       Present  Fetal Lie:              Maternal Right  Presentation:           Breech  Placenta:               Posterior right, fundal  Amniotic Fluid  AFI FV:      Within normal limits  AFI Sum(cm)     %Tile       Largest Pocket(cm)  14.87           53          5.6  RUQ(cm)       RLQ(cm)       LUQ(cm)        LLQ(cm)  1.82          3.25          4.2            5.6 ---------------------------------------------------------------------- BIOMETRY:  BPD:      77.4  mm     G. Age:  31w 0d        < 1  %    CI:         69.25   %    70 - 86                                                          FL/HC:      22.3   %    19.4 - 21.8  HC:       297   mm     G. Age:  32w 6d        < 3  %    HC/AC:      1.07        0.96 - 1.11  AC:      278.1  mm     G. Age:  31w 6d          3  %    FL/BPD:     85.5   %    71 - 87  FL:       66.2  mm     G. Age:  34w 1d         32  %    FL/AC:      23.8   %    20 - 24  Est. FW:    2002  gm      4 lb 7 oz      8  % ----------------------------------------------------------------------  OB HISTORY:  Gravidity:    1 ---------------------------------------------------------------------- GESTATIONAL AGE:  LMP:           34w 3d        Date:  03/06/18                 EDD:   12/11/18  U/S Today:     32w 3d                                        EDD:   12/25/18  Best:          34w 3d     Det. By:  LMP  (03/06/18)          EDD:   12/11/18 ---------------------------------------------------------------------- ANATOMY:  Cranium:               Within Normal Limits   Diaphragm:              Within Normal Limits  Cavum:                 Normal appearance      Stomach:                Seen  Ventricles:            Normal appearance      Kidneys:                Normal appearance  Thoracic:              Normal appearance      Bladder:                Seen  Heart:                 Normal 4 chamber       Spine:                  Normal appearance                         view ---------------------------------------------------------------------- DOPPLER - FETAL VESSELS:  Umbilical Artery   S/D     %tile   2.4       44 ---------------------------------------------------------------------- IMPRESSION:  ,  Thank you for referring your patient for a fetal growth  evaluation for FGR and Hypertension .  There is a singleton gestation with normal amniotic fluid  volume.  Fetus is BREECH  The pt is at 64 w 3d by her LMP and earliest scan at [facility]  on 17w 5d on 07/14/2018.  The estimated fetal weight is at the 8th   percentile.  (   previously at the 6th percentile)  However, the abdominal circumference is less than the 3rd  percentile.  This finding could be a sign of intrauterine growth restriction  (IUGR).  It must be noted that estimated fetal weight by ultrasound and  actual birthweight can differ up to 15 percent.  The BPP was noted to be 8/8 which was reassuring.  FHR noted to be in the 160s and the patient was placed on  the monitor for a NST.  The umbilical artery Doppler studies were within normal limits  (S/D = 2.4)  Weekly testing (BPP and Doppler studies) is suggested.  Serial growth scans are suggested (approximately every 3  weeks).  Given her history of HTN on labetalol  and FGR, Consider  early term delivery at 37-38 weeks .  No absolute contraindications to external cephalic version,  (normal fluid and posterior placenta ).  Her BP was initially elevated but improved after her  ultrasound.  Pt c/o hand pain consistent with carpal tunnel .  Thank you for allowing [place] to participate in your patient's care.  Please do not hesitate to contact us if we can be of further  assistance. ----------------------------------------------------------------------              Jimmey Ralph, MD Electronically Signed Final Report   11/02/2018 12:37 pm ----------------------------------------------------------------------  [place] Mfm Ua Doppler Re-eval  Result Date: 11/09/2018 ----------------------------------------------------------------------  OBSTETRICS REPORT                       (Signed Final 10/26/2018 10:48 am) ---------------------------------------------------------------------- PATIENT INFO:  ID #:       [id number]                          D.O.B.:  1995-07-01 (23 yrs)  Name:       Lorenda Peck                 Visit Date: 10/26/2018 10:07 am ---------------------------------------------------------------------- PERFORMED BY:  Performed By:     Ceasar Mons          Ref. Address:     [address]                                                             [place], [place],                                                             [place] [address]  Referred By:      Leola Brazil MD ---------------------------------------------------------------------- SERVICE(S) PROVIDED:   [place] MFM FETAL BPP WO NON STRESS                       [phone]  ---------------------------------------------------------------------- INDICATIONS:   [redacted] weeks gestation of pregnancy                Z3A.33  ---------------------------------------------------------------------- FETAL EVALUATION:  Num Of Fetuses:         1  Fetal Heart Rate(bpm):  168  Cardiac Activity:       Present  Presentation:           Breech  Placenta:               Posterior  AFI Sum(cm)     %Tile       Largest Pocket(cm)  12.97           40          3.44  RUQ(cm)       RLQ(cm)  LUQ(cm)        LLQ(cm)  3.44          3.23          3.29           3.01 ---------------------------------------------------------------------- BIOPHYSICAL EVALUATION:  Amniotic F.V:   Within normal limits       F. Tone:        Observed  F. Movement:    Observed                   Score:          8/8  F. Breathing:   Observed ---------------------------------------------------------------------- OB HISTORY:  Gravidity:    1 ---------------------------------------------------------------------- GESTATIONAL AGE:  LMP:           33w 3d        Date:  03/06/18                 EDD:   12/11/18  Best:          33w 3d     Det. By:  LMP  (03/06/18)          EDD:   12/11/18 ---------------------------------------------------------------------- ANATOMY:  Stomach:               Seen                   Bladder:                Seen ---------------------------------------------------------------------- DOPPLER - FETAL VESSELS:  Umbilical Artery   S/D     %tile     RI    %tile                     PSV                                                   (cm/s)   2.3       34   0.52         16                     51.7 ---------------------------------------------------------------------- CERVIX UTERUS ADNEXA:  Cervix  Length:            4.8  cm. ---------------------------------------------------------------------- IMPRESSION:  ,  Thank you for referring your patient  for fetal biophysical  profile (BPP) and umbilical artery Dopplers due to FGR (6th  %ile)and HTN on meds .Marland Kitchen  There is a singleton gestation with normal amniotic fluid  volume.  The pregnancy is at 33w 3d vased on LMP of 03/16/18 c/w  u/s performed at [facility] on 07/14/18 at 17w 5d .  The BPP was noted to be 8/8, which was reassuring.  Fetus is BREECH  The umbilical artery Doppler studies were within normal limits  for gestational age (S/D = 2.3 34 % ile )  Weekly testing (BPP and Doppler studies) is  suggested.Increase to twice weekly assessment with an  additional BPP or NST at 36 weeks.  Serial growth scans are suggested (approximately every 3  weeks).  OUr practice pattern is to deliver FGR fetuses >5th %ile at 39  weeks - given pt has HTN on meds could conisder moving  earlier .  Patient is on labetalol 400 bid initial BP was 143/90 pt  recently took meds - came down at end of visit using large  cuff 125/85 range  Thank you for allowing [place] to participate in your patient's care.  Please do not hesitate to contact us if we can be of further  assistance. ----------------------------------------------------------------------              Jimmey Ralph, MD Electronically Signed Final Report   10/26/2018 10:48 am ----------------------------------------------------------------------   Assessment: 37+[redacted] weeks gestation FHR category 2   Plan:  Admitting for fetal tachycardia with suspected fetal growth restriction with EFW 8% but AC 3% and chronic HTN on medication. Breech presentation, posterior placenta, adequate fluid: plan for attempted external cephalic version with regional anesthesia, with plan for c/s if  unsuccessful. Will plan for induction if successful and fetus tolerates.  Pt ate at 9:00- continuous fetal monitoring until 8 hrs postprandial   1. Fetal Well being  - Fetal Tracing: Cat II - Ultrasound:  reviewed, as above - Group B Streptococcus: neg - Presentation: breech   2. Routine OB: - Prenatal labs reviewed, as above - Rh O pos Ab neg - Covid negative  3. Post Partum Planning: - Infant feeding: breast - Contraception: ocps

## 2018-11-20 NOTE — Progress Notes (Signed)
Pt G1P0 presents from the office at [redacted]w[redacted]d due fetal heart rate 160-170 in the office. Pt reports baby is breech and set up for version on Monday. Pt reports GHTN and takes labetalol 400mg  2x day. Reports +FM. Denies LOF/bleeding/CTX. No other concerns this pregnancy. Monitors applied. Initial FHT 185. Will continue to monitor.

## 2018-11-20 NOTE — Discharge Summary (Signed)
Pt admitted for extended monitoring of suspected FGR fetus in breech position. After 8 hours of IV fluid resuscitation and continous fetal monitoring, her strip became Cat I for many hours. Because of OR staffing limitations, we would not be able to proceed with ECV until after 10pm, and the anesthesia team would be bare bones. The decision was made to delay ECV with its possible detrimental effects of the fetus until Monday as scheduled, with planning IOL if successful and pLTCS if not.  NST:  FHT- 155 Variability- moderate Accels- present 15x15 Decel- absent  Benjaman Kindler 11/20/18

## 2018-11-21 LAB — RPR: RPR Ser Ql: NONREACTIVE

## 2018-11-22 MED ORDER — DEXTROSE 5 % IV SOLN
3.0000 g | INTRAVENOUS | Status: DC
  Filled 2018-11-22 (×2): qty 3000

## 2018-11-23 ENCOUNTER — Encounter: Payer: Self-pay | Admitting: *Deleted

## 2018-11-23 ENCOUNTER — Inpatient Hospital Stay: Payer: Medicaid Other | Admitting: Anesthesiology

## 2018-11-23 ENCOUNTER — Inpatient Hospital Stay
Admission: RE | Admit: 2018-11-23 | Discharge: 2018-11-26 | DRG: 806 | Disposition: A | Discharge status: Home / Self Care | Payer: Medicaid Other | Attending: Obstetrics and Gynecology | Admitting: Obstetrics and Gynecology

## 2018-11-23 ENCOUNTER — Encounter
Admission: RE | Disposition: A | Discharge status: Home / Self Care | Payer: Self-pay | Source: Home / Self Care | Attending: Obstetrics and Gynecology

## 2018-11-23 ENCOUNTER — Other Ambulatory Visit: Payer: Self-pay

## 2018-11-23 DIAGNOSIS — D649 Anemia, unspecified: Secondary | ICD-10-CM | POA: Diagnosis present

## 2018-11-23 DIAGNOSIS — O365990 Maternal care for other known or suspected poor fetal growth, unspecified trimester, not applicable or unspecified: Secondary | ICD-10-CM | POA: Diagnosis not present

## 2018-11-23 DIAGNOSIS — O1092 Unspecified pre-existing hypertension complicating childbirth: Secondary | ICD-10-CM | POA: Diagnosis not present

## 2018-11-23 DIAGNOSIS — O9902 Anemia complicating childbirth: Secondary | ICD-10-CM | POA: Diagnosis present

## 2018-11-23 DIAGNOSIS — Z3A37 37 weeks gestation of pregnancy: Secondary | ICD-10-CM | POA: Diagnosis not present

## 2018-11-23 DIAGNOSIS — O10919 Unspecified pre-existing hypertension complicating pregnancy, unspecified trimester: Secondary | ICD-10-CM | POA: Diagnosis present

## 2018-11-23 DIAGNOSIS — O1002 Pre-existing essential hypertension complicating childbirth: Secondary | ICD-10-CM | POA: Diagnosis present

## 2018-11-23 DIAGNOSIS — O321XX0 Maternal care for breech presentation, not applicable or unspecified: Principal | ICD-10-CM | POA: Diagnosis present

## 2018-11-23 DIAGNOSIS — O99214 Obesity complicating childbirth: Secondary | ICD-10-CM | POA: Diagnosis present

## 2018-11-23 DIAGNOSIS — O10913 Unspecified pre-existing hypertension complicating pregnancy, third trimester: Secondary | ICD-10-CM | POA: Diagnosis not present

## 2018-11-23 DIAGNOSIS — O365930 Maternal care for other known or suspected poor fetal growth, third trimester, not applicable or unspecified: Secondary | ICD-10-CM | POA: Diagnosis not present

## 2018-11-23 LAB — COMPREHENSIVE METABOLIC PANEL
ALT: 16 U/L (ref 0–44)
AST: 20 U/L (ref 15–41)
Albumin: 3.3 g/dL — ABNORMAL LOW (ref 3.5–5.0)
Alkaline Phosphatase: 65 U/L (ref 38–126)
Anion gap: 10 (ref 5–15)
BUN: 9 mg/dL (ref 6–20)
CO2: 19 mmol/L — ABNORMAL LOW (ref 22–32)
Calcium: 9.2 mg/dL (ref 8.9–10.3)
Chloride: 106 mmol/L (ref 98–111)
Creatinine, Ser: 0.48 mg/dL (ref 0.44–1.00)
GFR calc Af Amer: >60 mL/min (ref >60)
GFR calc non Af Amer: >60 mL/min (ref >60)
Glucose, Bld: 87 mg/dL (ref 70–99)
Potassium: 4.1 mmol/L (ref 3.5–5.1)
Sodium: 135 mmol/L (ref 135–145)
Total Bilirubin: 0.5 mg/dL (ref 0.3–1.2)
Total Protein: 7 g/dL (ref 6.5–8.1)

## 2018-11-23 LAB — CBC WITH DIFFERENTIAL/PLATELET
Abs Immature Granulocytes: 0.05 10*3/uL (ref 0.00–0.07)
Basophils Absolute: 0 10*3/uL (ref 0.0–0.1)
Basophils Relative: 0 %
Eosinophils Absolute: 0 10*3/uL (ref 0.0–0.5)
Eosinophils Relative: 0 %
HCT: 34.6 % — ABNORMAL LOW (ref 36.0–46.0)
Hemoglobin: 11.3 g/dL — ABNORMAL LOW (ref 12.0–15.0)
Immature Granulocytes: 1 %
Lymphocytes Relative: 19 %
Lymphs Abs: 1.8 10*3/uL (ref 0.7–4.0)
MCH: 27 pg (ref 26.0–34.0)
MCHC: 32.7 g/dL (ref 30.0–36.0)
MCV: 82.6 fL (ref 80.0–100.0)
Monocytes Absolute: 0.5 10*3/uL (ref 0.1–1.0)
Monocytes Relative: 6 %
Neutro Abs: 7 10*3/uL (ref 1.7–7.7)
Neutrophils Relative %: 74 %
Platelets: 326 10*3/uL (ref 150–400)
RBC: 4.19 MIL/uL (ref 3.87–5.11)
RDW: 14.1 % (ref 11.5–15.5)
WBC: 9.4 10*3/uL (ref 4.0–10.5)
nRBC: 0 % (ref 0.0–0.2)

## 2018-11-23 LAB — TYPE AND SCREEN
ABO/RH(D): O POS
Antibody Screen: NEGATIVE

## 2018-11-23 SURGERY — Surgical Case
Anesthesia: Spinal | Site: Abdomen

## 2018-11-23 MED ORDER — ONDANSETRON HCL 4 MG/2ML IJ SOLN: 4.0000 mg | Freq: Four times a day (QID) | INTRAMUSCULAR | Status: DC | PRN

## 2018-11-23 MED ORDER — LIDOCAINE HCL (PF) 1 % IJ SOLN
30.0000 mL | INTRAMUSCULAR | Status: DC | PRN
  Filled 2018-11-23: qty 30

## 2018-11-23 MED ORDER — BUPIVACAINE LIPOSOME 1.3 % IJ SUSP
INTRAMUSCULAR | Status: AC
  Filled 2018-11-23: qty 20

## 2018-11-23 MED ORDER — MORPHINE SULFATE (PF) 0.5 MG/ML IJ SOLN
INTRAMUSCULAR | Status: DC | PRN
  Administered 2018-11-23: .1 mg via INTRATHECAL

## 2018-11-23 MED ORDER — ONDANSETRON HCL 4 MG/2ML IJ SOLN
INTRAMUSCULAR | Status: DC | PRN
  Administered 2018-11-23: 4 mg via INTRAVENOUS

## 2018-11-23 MED ORDER — SOD CITRATE-CITRIC ACID 500-334 MG/5ML PO SOLN
ORAL | Status: AC
  Filled 2018-11-23: qty 30

## 2018-11-23 MED ORDER — OXYTOCIN 40 UNITS IN NORMAL SALINE INFUSION - SIMPLE MED
INTRAVENOUS | Status: AC
  Filled 2018-11-23: qty 1000

## 2018-11-23 MED ORDER — BUPIVACAINE IN DEXTROSE 0.75-8.25 % IT SOLN
INTRATHECAL | Status: DC | PRN
  Administered 2018-11-23: 1.7 mL via INTRATHECAL

## 2018-11-23 MED ORDER — SOD CITRATE-CITRIC ACID 500-334 MG/5ML PO SOLN: 30.0000 mL | ORAL | Status: DC | PRN

## 2018-11-23 MED ORDER — MORPHINE SULFATE (PF) 0.5 MG/ML IJ SOLN
INTRAMUSCULAR | Status: AC
  Filled 2018-11-23: qty 10

## 2018-11-23 MED ORDER — SODIUM CHLORIDE (PF) 0.9 % IJ SOLN
INTRAMUSCULAR | Status: AC
  Filled 2018-11-23: qty 50

## 2018-11-23 MED ORDER — PHENYLEPHRINE HCL (PRESSORS) 10 MG/ML IV SOLN
INTRAVENOUS | Status: AC
  Filled 2018-11-23: qty 1

## 2018-11-23 MED ORDER — LACTATED RINGERS IV SOLN
INTRAVENOUS | Status: DC
  Administered 2018-11-23 (×2): via INTRAVENOUS

## 2018-11-23 MED ORDER — FENTANYL 2.5 MCG/ML W/ROPIVACAINE 0.15% IN NS 100 ML EPIDURAL (ARMC)
EPIDURAL | Status: AC
  Filled 2018-11-23: qty 100

## 2018-11-23 MED ORDER — MISOPROSTOL 25 MCG QUARTER TABLET
25.0000 ug | ORAL_TABLET | ORAL | Status: DC | PRN
  Administered 2018-11-23 – 2018-11-24 (×3): 25 ug via VAGINAL
  Filled 2018-11-23 (×3): qty 1

## 2018-11-23 MED ORDER — OXYTOCIN BOLUS FROM INFUSION
500.0000 mL | Freq: Once | INTRAVENOUS | Status: AC
  Administered 2018-11-25: 02:00:00 500 mL via INTRAVENOUS

## 2018-11-23 MED ORDER — TERBUTALINE SULFATE 1 MG/ML IJ SOLN
INTRAMUSCULAR | Status: AC
  Filled 2018-11-23: qty 1

## 2018-11-23 MED ORDER — FENTANYL CITRATE (PF) 100 MCG/2ML IJ SOLN
INTRAMUSCULAR | Status: AC
  Filled 2018-11-23: qty 2

## 2018-11-23 MED ORDER — FENTANYL CITRATE (PF) 100 MCG/2ML IJ SOLN
INTRAMUSCULAR | Status: DC | PRN
  Administered 2018-11-23: 15 ug via INTRATHECAL

## 2018-11-23 MED ORDER — ACETAMINOPHEN 325 MG PO TABS: 650.0000 mg | ORAL_TABLET | ORAL | Status: DC | PRN

## 2018-11-23 MED ORDER — PHENYLEPHRINE HCL (PRESSORS) 10 MG/ML IV SOLN
INTRAVENOUS | Status: DC | PRN
  Administered 2018-11-23: 200 ug via INTRAVENOUS

## 2018-11-23 MED ORDER — LACTATED RINGERS IV SOLN: 500.0000 mL | INTRAVENOUS | Status: DC | PRN

## 2018-11-23 MED ORDER — SOD CITRATE-CITRIC ACID 500-334 MG/5ML PO SOLN
30.0000 mL | ORAL | Status: AC
  Administered 2018-11-23: 30 mL via ORAL

## 2018-11-23 MED ORDER — LABETALOL HCL 100 MG PO TABS
400.0000 mg | ORAL_TABLET | Freq: Two times a day (BID) | ORAL | Status: DC
  Administered 2018-11-24 (×3): 400 mg via ORAL
  Filled 2018-11-23 (×3): qty 4

## 2018-11-23 MED ORDER — PROPOFOL 10 MG/ML IV BOLUS
INTRAVENOUS | Status: AC
  Filled 2018-11-23: qty 20

## 2018-11-23 MED ORDER — OXYTOCIN 40 UNITS IN NORMAL SALINE INFUSION - SIMPLE MED
2.5000 [IU]/h | INTRAVENOUS | Status: DC
  Administered 2018-11-25: 03:00:00 2.5 [IU]/h via INTRAVENOUS

## 2018-11-23 MED ORDER — BUPIVACAINE HCL (PF) 0.5 % IJ SOLN
INTRAMUSCULAR | Status: AC
  Filled 2018-11-23: qty 30

## 2018-11-23 MED ORDER — BUTORPHANOL TARTRATE 2 MG/ML IJ SOLN: 1.0000 mg | INTRAMUSCULAR | Status: DC | PRN

## 2018-11-23 MED ORDER — LACTATED RINGERS IV SOLN
INTRAVENOUS | Status: DC
  Administered 2018-11-23 – 2018-11-24 (×4): via INTRAVENOUS

## 2018-11-23 MED ORDER — FENTANYL 2.5 MCG/ML W/ROPIVACAINE 0.15% IN NS 100 ML EPIDURAL (ARMC)
EPIDURAL | Status: DC | PRN
  Administered 2018-11-23: 6 mL/h via EPIDURAL

## 2018-11-23 MED ORDER — TERBUTALINE SULFATE 1 MG/ML IJ SOLN: 0.2500 mg | Freq: Once | INTRAMUSCULAR | Status: DC | PRN

## 2018-11-23 SURGICAL SUPPLY — 26 items
CANISTER SUCT 3000ML PPV (MISCELLANEOUS) ×1 IMPLANT
CHLORAPREP W/TINT 26 (MISCELLANEOUS) ×3 IMPLANT
COVER WAND RF STERILE (DRAPES) ×1 IMPLANT
DRSG TELFA 3X8 NADH (GAUZE/BANDAGES/DRESSINGS) IMPLANT
ELECT REM PT RETURN 9FT ADLT (ELECTROSURGICAL)
ELECTRODE REM PT RTRN 9FT ADLT (ELECTROSURGICAL) ×1 IMPLANT
GAUZE SPONGE 4X4 12PLY STRL (GAUZE/BANDAGES/DRESSINGS) ×1 IMPLANT
GOWN STRL REUS W/ TWL LRG LVL3 (GOWN DISPOSABLE) ×3 IMPLANT
GOWN STRL REUS W/TWL LRG LVL3 (GOWN DISPOSABLE) ×6
NDL HYPO 25GX1X1/2 BEV (NEEDLE) ×1 IMPLANT
NEEDLE HYPO 25GX1X1/2 BEV (NEEDLE) ×3 IMPLANT
NS IRRIG 1000ML POUR BTL (IV SOLUTION) ×1 IMPLANT
PACK C SECTION AR (MISCELLANEOUS) ×3 IMPLANT
PAD DRESSING TELFA 3X8 NADH (GAUZE/BANDAGES/DRESSINGS) ×1 IMPLANT
PAD OB MATERNITY 4.3X12.25 (PERSONAL CARE ITEMS) ×3 IMPLANT
PAD PREP 24X41 OB/GYN DISP (PERSONAL CARE ITEMS) ×3 IMPLANT
PENCIL SMOKE ULTRAEVAC 22 CON (MISCELLANEOUS) ×3 IMPLANT
SUT MNCRL 4-0 (SUTURE) ×2
SUT MNCRL 4-0 27XMFL (SUTURE) ×1
SUT VIC AB 0 CT1 36 (SUTURE) ×6 IMPLANT
SUT VIC AB 0 CTX 36 (SUTURE) ×4
SUT VIC AB 0 CTX36XBRD ANBCTRL (SUTURE) ×2 IMPLANT
SUT VIC AB 2-0 SH 27 (SUTURE) ×4
SUT VIC AB 2-0 SH 27XBRD (SUTURE) ×2 IMPLANT
SUTURE MNCRL 4-0 27XMF (SUTURE) ×1 IMPLANT
SYR 30ML LL (SYRINGE) ×2 IMPLANT

## 2018-11-23 NOTE — H&P (Signed)
OB ADMISSION/ HISTORY & PHYSICAL:  Admission Date: 11/23/2018  9:45 AM  Admit Diagnosis: breech  Sandy PeckKatherine Proehl is a 23 y.o. G1P0 presenting for external cephalic version with planned induction of labor vs pLTCS if unsuccessful.  Prenatal History: G1P0   EDC : 12/11/2018, by Last Menstrual Period  [facility] complicated by   - CHTN, normal baseline labs, on 81mg  ASA. On labetalol 400mg  BID - anemia, on po iron - prepregnancy BMI 41, normal baseline labs - fetal growth restriction: Dopplers and BPP wnl, 7/6- EFW 8% with AC 3% - Chlamydia POSITIVE at NOB. Treated 03/19 ET, [facility] Results: Negative - breech presentation since 21 wks  Medical / Surgical History :  Past medical history:  Past Medical History:  Diagnosis Date  . Medical history non-contributory      Past surgical history:  Past Surgical History:  Procedure Laterality Date  . NO PAST SURGERIES      Family History:  Family History  Problem Relation Age of Onset  . Aneurysm Mother        Brain  . Heart murmur Brother   . Aneurysm Maternal Grandmother   . Stroke Maternal Grandmother   . Diabetes Paternal Grandmother      Social History:  reports that she has never smoked. She has never used smokeless tobacco. She reports that she does not drink alcohol or use drugs.   Allergies: Patient has no known allergies.    Current Medications at time of admission:  Prior to Admission medications   Medication Sig Start Date End Date Taking? Authorizing Provider  aspirin EC 81 MG tablet Take 81 mg by mouth daily.   Yes [provider]  ferrous sulfate 325 (65 FE) MG tablet Take 650 mg by mouth every evening.    Yes [provider]  labetalol (NORMODYNE) 200 MG tablet Take 400 mg by mouth 2 (two) times daily.    Yes [provider]  Prenatal Vit-Fe Fumarate-FA (PRENATAL MULTIVITAMIN) TABS tablet Take 1 tablet by mouth every evening.     Yes [provider]  vitamin C (ASCORBIC ACID) 250 MG tablet Take 250 mg by mouth every evening.    Yes [provider]     Review of Systems: No HA, edema Good fetal movement  Physical Exam:  VS: Blood pressure (!) 147/84, pulse (!) 105, temperature 98.6 F (37 C), temperature source Oral, resp. rate 18, height 5\' 6"  (1.676 m), weight 129.7 kg, last menstrual period 03/06/2018.  General: alert and oriented, appears NAD Heart: RRR Lungs: Clear lung fields Abdomen: Gravid, soft and non-tender, non-distended Extremities: trace edema  FHT: 160, moderate variability, +accels, no decels  Breech by leopolds  Prenatal Labs: Blood type/Rh --/--/O POS (07/27 1017)  Antibody screen neg  Rubella Immune  Varicella Immune  RPR NR  HBsAg Neg  HIV NR  GC neg  Chlamydia neg  Genetic screening negative  1 hour GTT 94  3 hour GTT n/a  GBS neg   [place] Mfm Fetal Bpp W/nonstress  Result Date: 11/09/2018 ----------------------------------------------------------------------  OBSTETRICS REPORT                       (Signed Final 11/02/2018 12:37 pm) ---------------------------------------------------------------------- PATIENT INFO:  ID #:       [id number]  D.O.B.:  1995/10/21 (23 yrs)  Name:       Sandy Sparks                 Visit Date: 11/02/2018 11:55 am ---------------------------------------------------------------------- PERFORMED BY:  Performed By:     Verne Grain            Ref. Address:     [address]                                                             [place], [place],                                                             [place] [address]  Referred By:      Leola Brazil MD ---------------------------------------------------------------------- SERVICE(S) PROVIDED:   [place] MFM OB FOLLOW UP                                  [phone]   ---------------------------------------------------------------------- INDICATIONS:   [redacted] weeks gestation of pregnancy                Z3A.34  ---------------------------------------------------------------------- FETAL EVALUATION:  Num Of Fetuses:         1  Fetal Heart Rate(bpm):  169  Cardiac Activity:       Present  Fetal Lie:              Maternal Right  Presentation:           Breech  Placenta:               Posterior right, fundal  Amniotic Fluid  AFI FV:      Within normal limits  AFI Sum(cm)     %Tile       Largest Pocket(cm)  14.87           53          5.6  RUQ(cm)       RLQ(cm)       LUQ(cm)        LLQ(cm)  1.82          3.25          4.2            5.6 ---------------------------------------------------------------------- BIOMETRY:  BPD:      77.4  mm     G. Age:  31w 0d        < 1  %    CI:        69.25   %    70 - 86  FL/HC:      22.3   %    19.4 - 21.8  HC:       297   mm     G. Age:  32w 6d        < 3  %    HC/AC:      1.07        0.96 - 1.11  AC:      278.1  mm     G. Age:  31w 6d          3  %    FL/BPD:     85.5   %    71 - 87  FL:       66.2  mm     G. Age:  34w 1d         32  %    FL/AC:      23.8   %    20 - 24  Est. FW:    2002  gm      4 lb 7 oz      8  % ---------------------------------------------------------------------- OB HISTORY:  Gravidity:    1 ---------------------------------------------------------------------- GESTATIONAL AGE:  LMP:           34w 3d        Date:  03/06/18                 EDD:   12/11/18  U/S Today:     32w 3d                                        EDD:   12/25/18  Best:          34w 3d     Det. By:  LMP  (03/06/18)          EDD:   12/11/18 ---------------------------------------------------------------------- ANATOMY:  Cranium:               Within Normal Limits   Diaphragm:              Within Normal Limits  Cavum:                 Normal appearance      Stomach:                Seen  Ventricles:             Normal appearance      Kidneys:                Normal appearance  Thoracic:              Normal appearance      Bladder:                Seen  Heart:                 Normal 4 chamber       Spine:                  Normal appearance                         view ---------------------------------------------------------------------- DOPPLER - FETAL VESSELS:  Umbilical Artery   S/D     %tile   2.4       44 ---------------------------------------------------------------------- IMPRESSION:  ,  Thank you for referring your patient for a fetal growth  evaluation for FGR and Hypertension .  There is a singleton gestation with normal amniotic fluid  volume.  Fetus is BREECH  The pt is at 34 w 3d by her LMP and earliest scan at [facility]  on 17w 5d on 07/14/2018.  The estimated fetal weight is at the 8th   percentile.  (  previously at the 6th percentile)  However, the abdominal circumference is less than the 3rd  percentile.  This finding could be a sign of intrauterine growth restriction  (IUGR).  It must be noted that estimated fetal weight by ultrasound and  actual birthweight can differ up to 15 percent.  The BPP was noted to be 8/8 which was reassuring.  FHR noted to be in the 160s and the patient was placed on  the monitor for a NST.  The umbilical artery Doppler studies were within normal limits  (S/D = 2.4)  Weekly testing (BPP and Doppler studies) is suggested.  Serial growth scans are suggested (approximately every 3  weeks).  Given her history of HTN on labetalol and FGR, Consider  early term delivery at 37-38 weeks .  No absolute contraindications to external cephalic version,  (normal fluid and posterior placenta ).  Her BP was initially elevated but improved after her  ultrasound.  Pt c/o hand pain consistent with carpal tunnel .  Thank you for allowing [place] to participate in your patient's care.  Please do not hesitate to contact us if we can be of further  assistance.  ----------------------------------------------------------------------              Jimmey Ralph, MD Electronically Signed Final Report   11/02/2018 12:37 pm ----------------------------------------------------------------------  [place] Mfm Fetal Bpp Wo Non Stress  Result Date: 11/16/2018 ----------------------------------------------------------------------  OBSTETRICS REPORT                       (Signed Final 11/16/2018 12:27 pm) ---------------------------------------------------------------------- PATIENT INFO:  ID #:       [id number]                          D.O.B.:  05/18/1995 (23 yrs)  Name:       Sandy Sparks                 Visit Date: 11/16/2018 12:11 pm ---------------------------------------------------------------------- PERFORMED BY:  Performed By:     Ceasar Mons          Ref. Address:     [address]                                                             [place], [place],                                                             [place] [address]  Referred By:      Elenora Fender  WARD MD ---------------------------------------------------------------------- SERVICE(S) PROVIDED:   [place] MFM FETAL BPP WO NON STRESS                       76819.01  ---------------------------------------------------------------------- INDICATIONS:   [redacted] weeks gestation of pregnancy                Z3A.36  ---------------------------------------------------------------------- FETAL EVALUATION:  Num Of Fetuses:         1  Fetal Heart Rate(bpm):  168  Cardiac Activity:       Present  Presentation:           Breech  Placenta:               Fundal  AFI Sum(cm)     %Tile       Largest Pocket(cm)  14.41           53          4.35  RUQ(cm)       RLQ(cm)       LUQ(cm)        LLQ(cm)  3.59          4.35          3.56           2.91 ---------------------------------------------------------------------- BIOPHYSICAL EVALUATION:  Amniotic F.V:   Within normal limits       F. Tone:         Observed  F. Movement:    Observed                   Score:          8/8  F. Breathing:   Observed ---------------------------------------------------------------------- OB HISTORY:  Gravidity:    1 ---------------------------------------------------------------------- GESTATIONAL AGE:  LMP:           36w 3d        Date:  03/06/18                 EDD:   12/11/18  Best:          Harolyn Rutherford 3d     Det. By:  LMP  (03/06/18)          EDD:   12/11/18 ---------------------------------------------------------------------- DOPPLER - FETAL VESSELS:  Umbilical Artery   S/D     %tile  2.08       31 ---------------------------------------------------------------------- CERVIX UTERUS ADNEXA:  Cervix  Suboptimal ---------------------------------------------------------------------- IMPRESSION:  ,  Thank you for referring your patient for fetal biophysical  profile (BPP) and umbilical artery Dopplers due to IUGR (8th  percentile) and hypertension.  There is a singleton, BREECH gestation with normal amniotic  fluid volume.  The BPP was noted to be 8/8, which was reassuring.  The umbilical artery Doppler studies were within normal limits  for gestational age  She is scheduled for delivery next week.  Thank you for allowing [place] to participate in your patient's care.  Please do not hesitate to contact us if we can be of further  assistance. ----------------------------------------------------------------------                   Manfred Shirts, MD Electronically Signed Final Report   11/16/2018 12:27 pm ----------------------------------------------------------------------  [place] Mfm Fetal Bpp Wo Non Stress  Result Date: 11/09/2018 ----------------------------------------------------------------------  OBSTETRICS REPORT                       (Signed Final 10/26/2018 10:48 am) ---------------------------------------------------------------------- PATIENT INFO:  ID #:  [id number]                          D.O.B.:  Jul 14, 1995 (23 yrs)  Name:        Sandy Sparks                 Visit Date: 10/26/2018 10:07 am ---------------------------------------------------------------------- PERFORMED BY:  Performed By:     Ceasar Mons          Ref. Address:     [address]                                                             [place], [place],                                                             [place] [address]  Referred By:      Leola Brazil MD ---------------------------------------------------------------------- SERVICE(S) PROVIDED:   [place] MFM FETAL BPP WO NON STRESS                       [phone]  ---------------------------------------------------------------------- INDICATIONS:   [redacted] weeks gestation of pregnancy                Z3A.33  ---------------------------------------------------------------------- FETAL EVALUATION:  Num Of Fetuses:         1  Fetal Heart Rate(bpm):  168  Cardiac Activity:       Present  Presentation:           Breech  Placenta:               Posterior  AFI Sum(cm)     %Tile       Largest Pocket(cm)  12.97           40          3.44  RUQ(cm)       RLQ(cm)       LUQ(cm)        LLQ(cm)  3.44          3.23          3.29           3.01 ---------------------------------------------------------------------- BIOPHYSICAL EVALUATION:  Amniotic F.V:   Within normal limits       F. Tone:        Observed  F. Movement:    Observed                   Score:          8/8  F. Breathing:   Observed ---------------------------------------------------------------------- OB HISTORY:  Gravidity:    1 ---------------------------------------------------------------------- GESTATIONAL AGE:  LMP:           33w 3d        Date:  03/06/18  EDD:   12/11/18  Best:          33w 3d     Det. By:  LMP  (03/06/18)          EDD:   12/11/18 ---------------------------------------------------------------------- ANATOMY:  Stomach:               Seen                   Bladder:                Seen  ---------------------------------------------------------------------- DOPPLER - FETAL VESSELS:  Umbilical Artery   S/D     %tile     RI    %tile                     PSV                                                   (cm/s)   2.3       34   0.52        16                     51.7 ---------------------------------------------------------------------- CERVIX UTERUS ADNEXA:  Cervix  Length:            4.8  cm. ---------------------------------------------------------------------- IMPRESSION:  ,  Thank you for referring your patient  for fetal biophysical  profile (BPP) and umbilical artery Dopplers due to FGR (6th  %ile)and HTN on meds .Marland Kitchen  There is a singleton gestation with normal amniotic fluid  volume.  The pregnancy is at 33w 3d vased on LMP of 03/16/18 c/w  u/s performed at [facility] clinic on 07/14/18 at 17w 5d .  The BPP was noted to be 8/8, which was reassuring.  Fetus is BREECH  The umbilical artery Doppler studies were within normal limits  for gestational age (S/D = 2.3 34 % ile )  Weekly testing (BPP and Doppler studies) is  suggested.Increase to twice weekly assessment with an  additional BPP or NST at 36 weeks.  Serial growth scans are suggested (approximately every 3  weeks).  OUr practice pattern is to deliver FGR fetuses >5th %ile at 39  weeks - given pt has HTN on meds could conisder moving  earlier .  Patient is on labetalol 400 bid initial BP was 143/90 pt  recently took meds - came down at end of visit using large  cuff 125/85 range  Thank you for allowing [place] to participate in your patient's care.  Please do not hesitate to contact us if we can be of further  assistance. ----------------------------------------------------------------------              Jimmey Ralph, MD Electronically Signed Final Report   10/26/2018 10:48 am ----------------------------------------------------------------------  [place] Mfm Ob Follow Up  Result Date:  11/09/2018 ----------------------------------------------------------------------  OBSTETRICS REPORT                       (Signed Final 11/02/2018 12:37 pm) ---------------------------------------------------------------------- PATIENT INFO:  ID #:       [id number]                          D.O.B.:  18-Aug-1995 (23 yrs)  Name:  Sandy Sparks                 Visit Date: 11/02/2018 11:55 am ---------------------------------------------------------------------- PERFORMED BY:  Performed By:     Verne Grain            Ref. Address:     [address]                                                             [place], [place],                                                             [place] [address]  Referred By:      Leola Brazil MD ---------------------------------------------------------------------- SERVICE(S) PROVIDED:   [place] MFM OB FOLLOW UP                                  [phone]  ---------------------------------------------------------------------- INDICATIONS:   [redacted] weeks gestation of pregnancy                Z3A.34  ---------------------------------------------------------------------- FETAL EVALUATION:  Num Of Fetuses:         1  Fetal Heart Rate(bpm):  169  Cardiac Activity:       Present  Fetal Lie:              Maternal Right  Presentation:           Breech  Placenta:               Posterior right, fundal  Amniotic Fluid  AFI FV:      Within normal limits  AFI Sum(cm)     %Tile       Largest Pocket(cm)  14.87           53          5.6  RUQ(cm)       RLQ(cm)       LUQ(cm)        LLQ(cm)  1.82          3.25          4.2            5.6 ---------------------------------------------------------------------- BIOMETRY:  BPD:      77.4  mm     G. Age:  31w 0d        < 1  %    CI:        69.25   %    70 - 86  FL/HC:      22.3   %    19.4 - 21.8  HC:       297   mm     G. Age:  32w 6d        < 3  %    HC/AC:       1.07        0.96 - 1.11  AC:      278.1  mm     G. Age:  31w 6d          3  %    FL/BPD:     85.5   %    71 - 87  FL:       66.2  mm     G. Age:  34w 1d         32  %    FL/AC:      23.8   %    20 - 24  Est. FW:    2002  gm      4 lb 7 oz      8  % ---------------------------------------------------------------------- OB HISTORY:  Gravidity:    1 ---------------------------------------------------------------------- GESTATIONAL AGE:  LMP:           34w 3d        Date:  03/06/18                 EDD:   12/11/18  U/S Today:     32w 3d                                        EDD:   12/25/18  Best:          34w 3d     Det. By:  LMP  (03/06/18)          EDD:   12/11/18 ---------------------------------------------------------------------- ANATOMY:  Cranium:               Within Normal Limits   Diaphragm:              Within Normal Limits  Cavum:                 Normal appearance      Stomach:                Seen  Ventricles:            Normal appearance      Kidneys:                Normal appearance  Thoracic:              Normal appearance      Bladder:                Seen  Heart:                 Normal 4 chamber       Spine:                  Normal appearance                         view ---------------------------------------------------------------------- DOPPLER - FETAL VESSELS:  Umbilical Artery   S/D     %tile   2.4       44 ---------------------------------------------------------------------- IMPRESSION:  ,  Thank you for referring your patient for a fetal growth  evaluation for FGR and Hypertension .  There is a singleton gestation with normal amniotic fluid  volume.  Fetus is BREECH  The pt is at 28 w 3d by her LMP and earliest scan at [facility]  on 17w 5d on 07/14/2018.  The estimated fetal weight is at the 8th   percentile.  (  previously at the 6th percentile)  However, the abdominal circumference is less than the 3rd  percentile.  This finding could be a sign of intrauterine growth restriction  (IUGR).  It  must be noted that estimated fetal weight by ultrasound and  actual birthweight can differ up to 15 percent.  The BPP was noted to be 8/8 which was reassuring.  FHR noted to be in the 160s and the patient was placed on  the monitor for a NST.  The umbilical artery Doppler studies were within normal limits  (S/D = 2.4)  Weekly testing (BPP and Doppler studies) is suggested.  Serial growth scans are suggested (approximately every 3  weeks).  Given her history of HTN on labetalol and FGR, Consider  early term delivery at 37-38 weeks .  No absolute contraindications to external cephalic version,  (normal fluid and posterior placenta ).  Her BP was initially elevated but improved after her  ultrasound.  Pt c/o hand pain consistent with carpal tunnel .  Thank you for allowing [place] to participate in your patient's care.  Please do not hesitate to contact us if we can be of further  assistance. ----------------------------------------------------------------------              Jimmey Ralph, MD Electronically Signed Final Report   11/02/2018 12:37 pm ----------------------------------------------------------------------  [place] Mfm Ua Doppler Re-eval  Result Date: 11/09/2018 ----------------------------------------------------------------------  OBSTETRICS REPORT                       (Signed Final 11/09/2018 01:30 pm) ---------------------------------------------------------------------- PATIENT INFO:  ID #:       [id number]                          D.O.B.:  [birth year] (23 yrs)  Name:       Sandy Sparks                 Visit Date: 11/09/2018 01:09 pm ---------------------------------------------------------------------- PERFORMED BY:  Performed By:     Neita Carp RDMS        Ref. Address:     [address], [place],                                                             [place] [address]  Referred By:      Leola Brazil MD  ---------------------------------------------------------------------- SERVICE(S) PROVIDED:  [place] MFM UA DOPPLER RE-EVAL                            76820.04  ---------------------------------------------------------------------- INDICATIONS:   [redacted] weeks gestation of pregnancy                Z3A.35  ---------------------------------------------------------------------- FETAL EVALUATION:  Num Of Fetuses:         1  Fetal Heart Rate(bpm):  152  Cardiac Activity:       Present  Presentation:           Breech  Placenta:               Anterior Posterior right, fundal  AFI Sum(cm)     %Tile       Largest Pocket(cm)  16.42           60          6.86  RUQ(cm)       RLQ(cm)       LUQ(cm)        LLQ(cm)  4.83          3.04          1.69           6.86 ---------------------------------------------------------------------- BIOPHYSICAL EVALUATION:  Amniotic F.V:   Within normal limits       F. Tone:        Observed  F. Movement:    Observed                   Score:          8/8  F. Breathing:   Observed ---------------------------------------------------------------------- OB HISTORY:  Gravidity:    1 ---------------------------------------------------------------------- GESTATIONAL AGE:  LMP:           35w 3d        Date:  03/06/18                 EDD:   12/11/18  Best:          Sandy Sparks 3d     Det. By:  LMP  (03/06/18)          EDD:   12/11/18 ---------------------------------------------------------------------- DOPPLER - FETAL VESSELS:  Umbilical Artery   S/D     %tile     RI    %tile                     PSV                                                   (cm/s)  2.96       75   0.66        81                     64.3 ---------------------------------------------------------------------- IMPRESSION:  Thank you for referring your patient for fetal biophysical  profile (BPP) and umbilical artery Dopplers due to fetal  growth restriction (efw at 8th percentile, however AC at 3rd  percentile, nl dopplers and AFI) due to matenral   hypertension.  BP today 136/77  There is a singleton gestation at 35 weeks 3 days with normal  amniotic fluid volume.  Dating is by LMP and earliest availabe  scan at [facility] on 07/14/18;  measurements were consistent  with 17 weeks 5 days.  The fetus remains BREECH.  The BPP was noted to be 8/8, which was reassuring.  The umbilical artery Doppler studies were within normal limits  for gestational age.  Weekly testing (BPP and Doppler studies) is suggested.  Consider delivery 37-38 weeks due to HTN and FGR.  Thank you for allowing us to participate in your patient's care.  Please do not hesitate to contact us if we can be of further  assistance. ----------------------------------------------------------------------                   Consuelo PandyMaria Small, MD Electronically Signed Final Report   11/09/2018 01:30 pm ----------------------------------------------------------------------  [place] Mfm Ua Doppler Re-eval  Result Date: 11/09/2018 ----------------------------------------------------------------------  OBSTETRICS REPORT                       (Signed Final 11/02/2018 12:37 pm) ---------------------------------------------------------------------- PATIENT INFO:  ID #:       [id number]                          D.O.B.:  October 03, 1995 (23 yrs)  Name:       Sandy PeckKATHERINE Sparks                 Visit Date: 11/02/2018 11:55 am ---------------------------------------------------------------------- PERFORMED BY:  Performed By:     Verne Grainonna Moody            Ref. Address:     [address]                    Sonographer                                                             [address], [place],                                                             [place] [id number]  Referred By:      Leola BrazilHELSEA C                    WARD MD ---------------------------------------------------------------------- SERVICE(S) PROVIDED:   US MFM OB FOLLOW UP                                  [phone]  ----------------------------------------------------------------------  INDICATIONS:   [redacted] weeks gestation of pregnancy                Z3A.34  ---------------------------------------------------------------------- FETAL EVALUATION:  Num Of Fetuses:         1  Fetal Heart Rate(bpm):  169  Cardiac Activity:       Present  Fetal Lie:              Maternal Right  Presentation:           Breech  Placenta:               Posterior right, fundal  Amniotic Fluid  AFI FV:  Within normal limits  AFI Sum(cm)     %Tile       Largest Pocket(cm)  14.87           53          5.6  RUQ(cm)       RLQ(cm)       LUQ(cm)        LLQ(cm)  1.82          3.25          4.2            5.6 ---------------------------------------------------------------------- BIOMETRY:  BPD:      77.4  mm     G. Age:  31w 0d        < 1  %    CI:        69.25   %    70 - 86                                                          FL/HC:      22.3   %    19.4 - 21.8  HC:       297   mm     G. Age:  32w 6d        < 3  %    HC/AC:      1.07        0.96 - 1.11  AC:      278.1  mm     G. Age:  31w 6d          3  %    FL/BPD:     85.5   %    71 - 87  FL:       66.2  mm     G. Age:  34w 1d         32  %    FL/AC:      23.8   %    20 - 24  Est. FW:    2002  gm      4 lb 7 oz      8  % ---------------------------------------------------------------------- OB HISTORY:  Gravidity:    1 ---------------------------------------------------------------------- GESTATIONAL AGE:  LMP:           34w 3d        Date:  03/06/18                 EDD:   12/11/18  U/S Today:     32w 3d                                        EDD:   12/25/18  Best:          34w 3d     Det. By:  LMP  (03/06/18)          EDD:   12/11/18 ---------------------------------------------------------------------- ANATOMY:  Cranium:               Within Normal Limits   Diaphragm:              Within Normal Limits  Cavum:  Normal appearance      Stomach:                Seen  Ventricles:            Normal appearance      Kidneys:                Normal appearance   Thoracic:              Normal appearance      Bladder:                Seen  Heart:                 Normal 4 chamber       Spine:                  Normal appearance                         view ---------------------------------------------------------------------- DOPPLER - FETAL VESSELS:  Umbilical Artery   S/D     %tile   2.4       44 ---------------------------------------------------------------------- IMPRESSION:  ,  Thank you for referring your patient for a fetal growth  evaluation for FGR and Hypertension .  There is a singleton gestation with normal amniotic fluid  volume.  Fetus is BREECH  The pt is at 84 w 3d by her LMP and earliest scan at [facility]  on 17w 5d on 07/14/2018.  The estimated fetal weight is at the 8th   percentile.  (  previously at the 6th percentile)  However, the abdominal circumference is less than the 3rd  percentile.  This finding could be a sign of intrauterine growth restriction  (IUGR).  It must be noted that estimated fetal weight by ultrasound and  actual birthweight can differ up to 15 percent.  The BPP was noted to be 8/8 which was reassuring.  FHR noted to be in the 160s and the patient was placed on  the monitor for a NST.  The umbilical artery Doppler studies were within normal limits  (S/D = 2.4)  Weekly testing (BPP and Doppler studies) is suggested.  Serial growth scans are suggested (approximately every 3  weeks).  Given her history of HTN on labetalol and FGR, Consider  early term delivery at 37-38 weeks .  No absolute contraindications to external cephalic version,  (normal fluid and posterior placenta ).  Her BP was initially elevated but improved after her  ultrasound.  Pt c/o hand pain consistent with carpal tunnel .  Thank you for allowing [place] to participate in your patient's care.  Please do not hesitate to contact us if we can be of further  assistance. ----------------------------------------------------------------------              Jimmey Ralph, MD  Electronically Signed Final Report   11/02/2018 12:37 pm ----------------------------------------------------------------------  [place] Mfm Ua Doppler Re-eval  Result Date: 11/09/2018 ----------------------------------------------------------------------  OBSTETRICS REPORT                       (Signed Final 10/26/2018 10:48 am) ---------------------------------------------------------------------- PATIENT INFO:  ID #:       [id number]                          D.O.B.:  1996/04/04 (23 yrs)  Name:       Sandy Sparks  Visit Date: 10/26/2018 10:07 am ---------------------------------------------------------------------- PERFORMED BY:  Performed By:     Ceasar Mons          Ref. Address:     [address]                                                             [place], [place],                                                             [place] [address]  Referred By:      Leola Brazil MD ---------------------------------------------------------------------- SERVICE(S) PROVIDED:   [place] MFM FETAL BPP WO NON STRESS                       [phone]  ---------------------------------------------------------------------- INDICATIONS:   [redacted] weeks gestation of pregnancy                Z3A.33  ---------------------------------------------------------------------- FETAL EVALUATION:  Num Of Fetuses:         1  Fetal Heart Rate(bpm):  168  Cardiac Activity:       Present  Presentation:           Breech  Placenta:               Posterior  AFI Sum(cm)     %Tile       Largest Pocket(cm)  12.97           40          3.44  RUQ(cm)       RLQ(cm)       LUQ(cm)        LLQ(cm)  3.44          3.23          3.29           3.01 ---------------------------------------------------------------------- BIOPHYSICAL EVALUATION:  Amniotic F.V:   Within normal limits       F. Tone:        Observed  F. Movement:    Observed                   Score:          8/8  F. Breathing:   Observed  ---------------------------------------------------------------------- OB HISTORY:  Gravidity:    1 ---------------------------------------------------------------------- GESTATIONAL AGE:  LMP:           33w 3d        Date:  03/06/18                 EDD:   12/11/18  Best:          33w 3d     Det. By:  LMP  (03/06/18)          EDD:   12/11/18 ---------------------------------------------------------------------- ANATOMY:  Stomach:  Seen                   Bladder:                Seen ---------------------------------------------------------------------- DOPPLER - FETAL VESSELS:  Umbilical Artery   S/D     %tile     RI    %tile                     PSV                                                   (cm/s)   2.3       34   0.52        16                     51.7 ---------------------------------------------------------------------- CERVIX UTERUS ADNEXA:  Cervix  Length:            4.8  cm. ---------------------------------------------------------------------- IMPRESSION:  ,  Thank you for referring your patient  for fetal biophysical  profile (BPP) and umbilical artery Dopplers due to FGR (6th  %ile)and HTN on meds .Marland Kitchen  There is a singleton gestation with normal amniotic fluid  volume.  The pregnancy is at 33w 3d vased on LMP of 03/16/18 c/w  u/s performed at [facility] on 07/14/18 at 17w 5d .  The BPP was noted to be 8/8, which was reassuring.  Fetus is BREECH  The umbilical artery Doppler studies were within normal limits  for gestational age (S/D = 2.3 34 % ile )  Weekly testing (BPP and Doppler studies) is  suggested.Increase to twice weekly assessment with an  additional BPP or NST at 36 weeks.  Serial growth scans are suggested (approximately every 3  weeks).  OUr practice pattern is to deliver FGR fetuses >5th %ile at 39  weeks - given pt has HTN on meds could conisder moving  earlier .  Patient is on labetalol 400 bid initial BP was 143/90 pt  recently took meds - came down at end of visit  using large  cuff 125/85 range  Thank you for allowing [place] to participate in your patient's care.  Please do not hesitate to contact us if we can be of further  assistance. ----------------------------------------------------------------------              Jimmey Ralph, MD Electronically Signed Final Report   10/26/2018 10:48 am ----------------------------------------------------------------------   Assessment: 37+3wks weeks gestation 1 stage of labor FHR category 1, with episodes of fetal tachycardia   Plan:   Deliver between 37-38wks because of fetal growth restriction. Because of breech presentation, will plan for external cephalic version at mother's request  1. Fetal Well being  - Fetal Tracing: Cat II due to high baseline. This has been present during her office NSTs as well - Ultrasound:  reviewed, as above - Group B Streptococcus: neg  2. Routine OB: - Prenatal labs reviewed, as above - Rh positive- Opos  3. Post Partum Planning: - Infant feeding: breast - Contraception: OCPs  ECV discussed, including risks (58% success rate, Sparks chance of spontaneous return to breech, AROM, placental abruption, NRFHT and stat c/s, maternal discomfort), benefits (chance at a vaginal birth) and alternatives (expectant management, cesarean section.) She will have regional anesthesia. She elects to  proceed with procedure. We will check CBC and Type and Screen for prn care. She will get a fluid bolus prior to start of procedure.

## 2018-11-23 NOTE — Anesthesia Post-op Follow-up Note (Signed)
Anesthesia QCDR form completed.        

## 2018-11-23 NOTE — Anesthesia Preprocedure Evaluation (Signed)
Anesthesia Evaluation  Patient identified by MRN, date of birth, ID band Patient awake    Reviewed: Allergy & Precautions, NPO status , Patient's Chart, lab work & pertinent test results  History of Anesthesia Complications Negative for: history of anesthetic complications  Airway Mallampati: III  TM Distance: >3 FB Neck ROM: Full    Dental no notable dental hx.    Pulmonary neg pulmonary ROS, neg sleep apnea, neg COPD,    breath sounds clear to auscultation- rhonchi (-) wheezing      Cardiovascular hypertension, Pt. on medications (-) CAD, (-) Past MI, (-) Cardiac Stents and (-) CABG  Rhythm:Regular Rate:Normal - Systolic murmurs and - Diastolic murmurs    Neuro/Psych neg Seizures negative neurological ROS  negative psych ROS   GI/Hepatic negative GI ROS, Neg liver ROS,   Endo/Other  negative endocrine ROSneg diabetes  Renal/GU negative Renal ROS     Musculoskeletal negative musculoskeletal ROS (+)   Abdominal (+) + obese,   Peds  Hematology negative hematology ROS (+)   Anesthesia Other Findings   Reproductive/Obstetrics (+) Pregnancy                            Lab Results  Component Value Date   WBC 9.4 11/23/2018   HGB 11.3 (L) 11/23/2018   HCT 34.6 (L) 11/23/2018   MCV 82.6 11/23/2018   PLT 326 11/23/2018    Anesthesia Physical Anesthesia Plan  ASA: III  Anesthesia Plan: Combined Spinal and Epidural   Post-op Pain Management:    Induction:   PONV Risk Score and Plan: 2 and Ondansetron  Airway Management Planned: Natural Airway  Additional Equipment:   Intra-op Plan:   Post-operative Plan:   Informed Consent: I have reviewed the patients History and Physical, chart, labs and discussed the procedure including the risks, benefits and alternatives for the proposed anesthesia with the patient or authorized representative who has indicated his/her understanding and  acceptance.     Dental advisory given  Plan Discussed with: CRNA and Anesthesiologist  Anesthesia Plan Comments:         Anesthesia Quick Evaluation

## 2018-11-23 NOTE — Anesthesia Preprocedure Evaluation (Signed)
Anesthesia Evaluation  Patient identified by MRN, date of birth, ID band Patient awake    Reviewed: Allergy & Precautions, NPO status , Patient's Chart, lab work & pertinent test results  History of Anesthesia Complications Negative for: history of anesthetic complications  Airway Mallampati: III  TM Distance: >3 FB Neck ROM: Full    Dental no notable dental hx.    Pulmonary neg pulmonary ROS, neg sleep apnea, neg COPD,    breath sounds clear to auscultation- rhonchi (-) wheezing      Cardiovascular hypertension, Pt. on medications (-) CAD, (-) Past MI, (-) Cardiac Stents and (-) CABG  Rhythm:Regular Rate:Normal - Systolic murmurs and - Diastolic murmurs    Neuro/Psych neg Seizures negative neurological ROS  negative psych ROS   GI/Hepatic negative GI ROS, Neg liver ROS,   Endo/Other  neg diabetesMorbid obesity  Renal/GU negative Renal ROS     Musculoskeletal negative musculoskeletal ROS (+)   Abdominal (+) + obese,   Peds  Hematology negative hematology ROS (+)   Anesthesia Other Findings Past Medical History: No date: Medical history non-contributory   Reproductive/Obstetrics (+) Pregnancy                             Lab Results  Component Value Date   WBC 9.4 11/23/2018   HGB 11.3 (L) 11/23/2018   HCT 34.6 (L) 11/23/2018   MCV 82.6 11/23/2018   PLT 326 11/23/2018    Anesthesia Physical  Anesthesia Plan  ASA: III  Anesthesia Plan: Epidural   Post-op Pain Management:    Induction:   PONV Risk Score and Plan:   Airway Management Planned:   Additional Equipment:   Intra-op Plan:   Post-operative Plan:   Informed Consent: I have reviewed the patients History and Physical, chart, labs and discussed the procedure including the risks, benefits and alternatives for the proposed anesthesia with the patient or authorized representative who has indicated his/her  understanding and acceptance.     Dental advisory given  Plan Discussed with: CRNA and Anesthesiologist  Anesthesia Plan Comments:         Anesthesia Quick Evaluation

## 2018-11-23 NOTE — Anesthesia Procedure Notes (Signed)
CSE Patient location during procedure: OB Start time: 11/23/2018 7:10 PM End time: 11/23/2018 7:14 PM  Staffing Anesthesiologist: Martha Clan, MD Performed: anesthesiologist   Preanesthetic Checklist Completed: patient identified, site marked, surgical consent, pre-op evaluation, timeout performed, IV checked, risks and benefits discussed and monitors and equipment checked  Epidural Patient position: sitting Prep: ChloraPrep Patient monitoring: heart rate, continuous pulse ox and blood pressure Approach: midline Location: L3-L4 Injection technique: LOR saline  Needle:  Needle type: Tuohy  Needle gauge: 17 G Needle length: 9 cm and 9 Needle insertion depth: 6.5 cm Catheter type: closed end flexible Catheter size: 19 Gauge Catheter at skin depth: 12 cm Test dose: negative and 1.5% lidocaine with Epi 1:200 K  Assessment Sensory level: T6 Events: blood not aspirated, injection not painful, no injection resistance, negative IV test and no paresthesia  Additional Notes Patient identified and IV placement confirmed prior to beginning procedure.  Sterile prep, drape, and hand hygiene performed prior to starting CSE.  After getting LOR, I passed a spinal needle through the epidural needle and got clear CSF flow back.  Injected medication, removed the spinal needle, and placed the epidural catheter.  No paresthesias noted on medication injection.  Patient tolerated the procedure well without complications.Reason for block:procedure for pain and surgical anesthesia

## 2018-11-23 NOTE — Op Note (Signed)
Sandy Dixonis G1P0 at [redacted]w[redacted]d with breech presentation who desires external cephalic version.  Preop: Breech presentation Postop: Vertex presentation  Pt taken to the OR and a combined spinal/epidural placed without difficulty. Ultrasound confirmed fetal head in upper right quadrant and back down to maternal right. With mineral oil, backward roll attempted and unsuccessful.  forward roll attempted and successful with fetal head documented in pelvis. Fetal heart rate documented in the normal baseline range throughout procedure. Bedside ultrasound confirmed cephalic spina up at the end of the procedure.   Pt tolerated well and we placed binder, with pt moved to [facility] for attempted induction for cHTN on meds with fetal growth restriction.   Benjaman Kindler 11/23/18

## 2018-11-23 NOTE — Transfer of Care (Signed)
Immediate Anesthesia Transfer of Care Note  Patient: Sandy Sparks  Procedure(s) Performed: external cephalic version (N/A Abdomen)  Patient Location: PACU  Anesthesia Type:spinal  Level of Consciousness: awake, alert , oriented and patient cooperative  Airway & Oxygen Therapy: Patient Spontanous Breathing  Post-op Assessment: Report given to RN and Post -op Vital signs reviewed and stable  Post vital signs: Reviewed and stable  Last Vitals:  Vitals Value Taken Time  BP    Temp    Pulse    Resp    SpO2      Last Pain:  Vitals:   11/23/18 1825  TempSrc:   PainSc: 0-No pain         Complications: No apparent anesthesia complications

## 2018-11-23 NOTE — OB Triage Note (Signed)
Presents for a version and admitted for labor C/S

## 2018-11-23 NOTE — Progress Notes (Signed)
Pt present and waiting since noon. Originally consented for combined spinal/epidural for ECV, but we were bumped due to staffing until now. Current anesthesia provider unable to do a combined spinal/epidural, so will place spinal for ECV and we will use for c/s if needed.

## 2018-11-24 ENCOUNTER — Encounter: Payer: Self-pay | Admitting: Obstetrics and Gynecology

## 2018-11-24 LAB — RPR: RPR Ser Ql: NONREACTIVE

## 2018-11-24 MED ORDER — MISOPROSTOL 50MCG HALF TABLET
50.0000 ug | ORAL_TABLET | ORAL | Status: DC | PRN
  Administered 2018-11-24: 11:00:00 50 ug via BUCCAL

## 2018-11-24 MED ORDER — AMMONIA AROMATIC IN INHA
RESPIRATORY_TRACT | Status: AC
  Filled 2018-11-24: qty 10

## 2018-11-24 MED ORDER — EPHEDRINE 5 MG/ML INJ: 10.0000 mg | INTRAVENOUS | Status: DC | PRN

## 2018-11-24 MED ORDER — OXYTOCIN 10 UNIT/ML IJ SOLN
INTRAMUSCULAR | Status: AC
  Filled 2018-11-24: qty 2

## 2018-11-24 MED ORDER — LACTATED RINGERS IV SOLN: 500.0000 mL | Freq: Once | INTRAVENOUS | Status: DC

## 2018-11-24 MED ORDER — MISOPROSTOL 200 MCG PO TABS
ORAL_TABLET | ORAL | Status: AC
  Filled 2018-11-24: qty 4

## 2018-11-24 MED ORDER — MISOPROSTOL 50MCG HALF TABLET
ORAL_TABLET | ORAL | Status: AC
  Filled 2018-11-24: qty 1

## 2018-11-24 MED ORDER — OXYTOCIN 40 UNITS IN NORMAL SALINE INFUSION - SIMPLE MED
1.0000 m[IU]/min | INTRAVENOUS | Status: DC
  Administered 2018-11-24: 18:00:00 2 m[IU]/min via INTRAVENOUS

## 2018-11-24 MED ORDER — PHENYLEPHRINE 40 MCG/ML (10ML) SYRINGE FOR IV PUSH (FOR BLOOD PRESSURE SUPPORT): 80.0000 ug | PREFILLED_SYRINGE | INTRAVENOUS | Status: DC | PRN

## 2018-11-24 MED ORDER — FENTANYL 2.5 MCG/ML W/ROPIVACAINE 0.15% IN NS 100 ML EPIDURAL (ARMC)
12.0000 mL/h | EPIDURAL | Status: DC
  Administered 2018-11-24 (×2): 6 mL/h via EPIDURAL
  Filled 2018-11-24 (×2): qty 100

## 2018-11-24 MED ORDER — DIPHENHYDRAMINE HCL 50 MG/ML IJ SOLN: 12.5000 mg | INTRAMUSCULAR | Status: DC | PRN

## 2018-11-24 MED ORDER — OXYTOCIN 40 UNITS IN NORMAL SALINE INFUSION - SIMPLE MED
INTRAVENOUS | Status: AC
  Filled 2018-11-24: qty 1000

## 2018-11-24 NOTE — Progress Notes (Signed)
Labor Progress Note  Sandy Sparks is a 23 y.o. G1P0 at [redacted]w[redacted]d admitted for induction of labor due to Hypertension and presumed FGR.   Subjective: comfortable with epidural at low rate.   Objective: BP (!) 101/58   Pulse 83   Temp 98.5 F (36.9 C) (Oral)   Resp 16   Ht 5\' 6"  (1.676 m)   Wt 129.7 kg   LMP 03/06/2018 (Exact Date)   SpO2 97%   BMI 46.15 kg/m  Notable VS details: reviewed  Fetal Assessment: FHT:  FHR: 155 bpm, variability: moderate,  accelerations:  Present,  decelerations:  Absent Category/reactivity:  Category I UC:   irregular, every 2-6 minutes SVE:   1/25/-2, soft/anterior Membrane status: intact  Cook catheter placed with 41ml sterile water in uterine and vaginal balloons. Pt tolerated well.   Labs: Lab Results  Component Value Date   WBC 9.4 11/23/2018   HGB 11.3 (L) 11/23/2018   HCT 34.6 (L) 11/23/2018   MCV 82.6 11/23/2018   PLT 326 11/23/2018    Assessment / Plan: G1 at 37.4wks for FGR and CHTN.   S/p successful version   Labor: s/p 3 doses of cytotec, now Sandy Sparks placed.  Preeclampsia:  no e/o pre-e Fetal Wellbeing:  Category I Pain Control:  Epidural I/D:  n/a Anticipated MOD:  NSVD  Sandy Sparks, CNM 11/24/2018, 1:54 PM

## 2018-11-25 LAB — CBC
HCT: 31.3 % — ABNORMAL LOW (ref 36.0–46.0)
Hemoglobin: 10.2 g/dL — ABNORMAL LOW (ref 12.0–15.0)
MCH: 27.4 pg (ref 26.0–34.0)
MCHC: 32.6 g/dL (ref 30.0–36.0)
MCV: 84.1 fL (ref 80.0–100.0)
Platelets: 295 10*3/uL (ref 150–400)
RBC: 3.72 MIL/uL — ABNORMAL LOW (ref 3.87–5.11)
RDW: 13.8 % (ref 11.5–15.5)
WBC: 16.2 10*3/uL — ABNORMAL HIGH (ref 4.0–10.5)
nRBC: 0 % (ref 0.0–0.2)

## 2018-11-25 MED ORDER — BENZOCAINE-MENTHOL 20-0.5 % EX AERO
1.0000 "application " | INHALATION_SPRAY | CUTANEOUS | Status: DC | PRN
  Administered 2018-11-25: 1 via TOPICAL

## 2018-11-25 MED ORDER — ZOLPIDEM TARTRATE 5 MG PO TABS: 5.0000 mg | ORAL_TABLET | Freq: Every evening | ORAL | Status: DC | PRN

## 2018-11-25 MED ORDER — DIPHENHYDRAMINE HCL 25 MG PO CAPS: 25.0000 mg | ORAL_CAPSULE | Freq: Four times a day (QID) | ORAL | Status: DC | PRN

## 2018-11-25 MED ORDER — COCONUT OIL OIL
1.0000 "application " | TOPICAL_OIL | Status: DC | PRN
  Filled 2018-11-25: qty 120

## 2018-11-25 MED ORDER — FERROUS SULFATE 325 (65 FE) MG PO TABS
325.0000 mg | ORAL_TABLET | Freq: Two times a day (BID) | ORAL | Status: DC
  Administered 2018-11-25 – 2018-11-26 (×3): 325 mg via ORAL
  Filled 2018-11-25 (×3): qty 1

## 2018-11-25 MED ORDER — SENNOSIDES-DOCUSATE SODIUM 8.6-50 MG PO TABS
2.0000 | ORAL_TABLET | ORAL | Status: DC
  Administered 2018-11-26: 09:00:00 2 via ORAL
  Filled 2018-11-25: qty 2

## 2018-11-25 MED ORDER — BENZOCAINE-MENTHOL 20-0.5 % EX AERO
INHALATION_SPRAY | CUTANEOUS | Status: AC
  Filled 2018-11-25: qty 56

## 2018-11-25 MED ORDER — PRENATAL MULTIVITAMIN CH
1.0000 | ORAL_TABLET | Freq: Every day | ORAL | Status: DC
  Administered 2018-11-25 – 2018-11-26 (×2): 1 via ORAL
  Filled 2018-11-25 (×2): qty 1

## 2018-11-25 MED ORDER — IBUPROFEN 600 MG PO TABS
600.0000 mg | ORAL_TABLET | Freq: Four times a day (QID) | ORAL | Status: DC
  Administered 2018-11-25 – 2018-11-26 (×6): 600 mg via ORAL
  Filled 2018-11-25 (×6): qty 1

## 2018-11-25 MED ORDER — ACETAMINOPHEN 325 MG PO TABS
ORAL_TABLET | ORAL | Status: AC
  Filled 2018-11-25: qty 2

## 2018-11-25 MED ORDER — ONDANSETRON HCL 4 MG PO TABS: 4.0000 mg | ORAL_TABLET | ORAL | Status: DC | PRN

## 2018-11-25 MED ORDER — LABETALOL HCL 200 MG PO TABS
400.0000 mg | ORAL_TABLET | Freq: Two times a day (BID) | ORAL | Status: DC
  Administered 2018-11-25 – 2018-11-26 (×3): 400 mg via ORAL
  Filled 2018-11-25 (×3): qty 2

## 2018-11-25 MED ORDER — ONDANSETRON HCL 4 MG/2ML IJ SOLN: 4.0000 mg | INTRAMUSCULAR | Status: DC | PRN

## 2018-11-25 MED ORDER — DIBUCAINE (PERIANAL) 1 % EX OINT: 1.0000 "application " | TOPICAL_OINTMENT | CUTANEOUS | Status: DC | PRN

## 2018-11-25 MED ORDER — BENZOCAINE-MENTHOL 20-0.5 % EX AERO: 1.0000 "application " | INHALATION_SPRAY | CUTANEOUS | Status: DC | PRN

## 2018-11-25 MED ORDER — ACETAMINOPHEN 325 MG PO TABS
650.0000 mg | ORAL_TABLET | ORAL | Status: DC | PRN
  Administered 2018-11-25 (×2): 650 mg via ORAL
  Filled 2018-11-25: qty 2

## 2018-11-25 MED ORDER — SIMETHICONE 80 MG PO CHEW: 80.0000 mg | CHEWABLE_TABLET | ORAL | Status: DC | PRN

## 2018-11-25 MED ORDER — WITCH HAZEL-GLYCERIN EX PADS: 1.0000 "application " | MEDICATED_PAD | CUTANEOUS | Status: DC | PRN

## 2018-11-25 NOTE — Lactation Note (Signed)
This note was copied from a baby's chart. Lactation Consultation Note  Patient Name: Sandy Sparks [id number] Date: 11/25/2018 Reason for consult: Follow-up assessment  LC assisted mom with position and latch of late preterm infant.  Infant was starting to display early hunger cues. LC assisted mom with hand expression before latching baby. LC helped mom place baby in football position on the left breast. After a couple of attempts infant was able to grasp the breast and sustain latch without the use of a nipple shield that was given earlier.  Baby displayed flanged upper and lower lips, and wide open mouth. Intermittent sucking was visible. Encouraged mom to continue to keep baby stimulated at the breast, continue with breast massage and compression to help with milk transfer. LC discussed with mom the continuation of pumping every 2-3 hours for additional breast stimulation, and anticipation of cluster feeding and growth spurt during the overnight hours, at the baby's 24 hour mark.  Mom understands and plans to put the baby to breast based on hunger cues, and will continue to pump every 2-3 hours during the night. Encouraged to call out for additional lactation support when needed.    Maternal Data Has patient been taught Hand Expression?: Yes Does the patient have breastfeeding experience prior to this delivery?: No  Feeding Feeding Type: Breast Fed  LATCH Score Latch: Repeated attempts needed to sustain latch, nipple held in mouth throughout feeding, stimulation needed to elicit sucking reflex.  Audible Swallowing: Spontaneous and intermittent  Type of Nipple: Everted at rest and after stimulation  Comfort (Breast/Nipple): Soft / non-tender  Hold (Positioning): Assistance needed to correctly position infant at breast and maintain latch.  LATCH Score: 8  Interventions Interventions: Position options;Support pillows;Assisted with latch;Breast feeding basics  reviewed  Lactation Tools Discussed/Used Pump Review: Setup, frequency, and cleaning;Milk Storage Initiated by:: Gerome Sam, MPH, IBCLC Date initiated:: 11/25/18   Consult Status Consult Status: Follow-up Date: 11/25/18 Follow-up type: [place] 11/25/2018, 4:22 PM

## 2018-11-25 NOTE — Plan of Care (Signed)
Vs stable; up with assistance for now; to mother/baby unit at 0445; has had motrin and tylenol for pain control; tolerating regular diet; does need assistance with breastfeeding and using nipple shield as well

## 2018-11-25 NOTE — Discharge Summary (Signed)
Obstetrical Discharge Summary  Patient Name: Sandy Sparks DOB: 04-22-1996 MRN: [id number]  Date of Admission: 11/23/2018 Date of Delivery: 11/25/18 Delivered by: Hassan Buckler CNM Date of Discharge: 11/26/2018  Primary OB: [place]  [id number] last menstrual period was 03/06/2018 (exact date). EDC Estimated Date of Delivery: 12/11/18 Gestational Age at Delivery: [redacted]w[redacted]d   Antepartum complications:  - CHTN, normal baseline labs, on 81mg  ASA. On labetalol 400mg  BID - anemia, on po iron - prepregnancy BMI 41, normal baseline labs - fetal growth restriction: Dopplers and BPP wnl, 7/6- EFW 8% with AC 3% - Chlamydia POSITIVE at NOB. Treated 03/19 ET, TOC at next visit 04/14 with [facility], TOC Results: Negative - breech presentation since 21 wks  Admitting Diagnosis: Breech presentation, CHTN, [redacted]wks gestation Secondary Diagnosis: external cephalic version, SVD Patient Active Problem List   Diagnosis Date Noted  . Chronic hypertension affecting pregnancy 11/23/2018  . Chronic hypertension in pregnancy 11/20/2018  . Breech presentation of fetus 11/20/2018  . Carpal tunnel syndrome 11/02/2018  . Indication for care in labor and delivery, antepartum 11/02/2018  . Pregnancy affected by fetal growth restriction 10/26/2018  . Antepartum hypertension 10/26/2018  . Obesity in pregnancy 10/26/2018  . Breech presentation 10/26/2018    Augmentation: Pitocin, Cytotec and Cook cath Complications: None Intrapartum complications/course: Cytotec x 4 doses total, Cook Cath and pitocin for IOL at 37.3wks due to [facility] after successful ECV on 7/27. Progressed to C/C/+1 and pushed effectively over intaact perineum.  Date of Delivery: 11/25/18 Delivered By: Hassan Buckler CNM Delivery Type: spontaneous vaginal delivery Anesthesia: epidural Placenta: spontaneous Laceration: none Episiotomy: none Newborn Data: "Sandy, Sparks Girl [place] [[id number]]  Live born female  Birth Weight: 6  lb 7 oz (2920 g) APGAR: 7, 9  Newborn Delivery   Birth date/time: 11/25/2018 01:54:00 Delivery type: Vaginal, Spontaneous        Postpartum Procedures: none  Post partum course:  Patient had an uncomplicated postpartum course.  By time of discharge on PPD#1, her pain was controlled on oral pain medications; she had appropriate lochia and was ambulating, voiding without difficulty and tolerating regular diet.  She was deemed stable for discharge to home.    Discharge Physical Exam:  BP 137/82 (BP Location: Right Arm)   Pulse 91   Temp 98 F (36.7 C) (Oral)   Resp 20   Ht 5\' 6"  (1.676 m)   Wt 129.7 kg   LMP 03/06/2018 (Exact Date)   SpO2 100% Comment: Room Air  Breastfeeding Unknown   BMI 46.15 kg/m   General: NAD CV: RRR Pulm: CTABL, nl effort ABD: s/nd/nt, fundus firm and below the umbilicus Lochia: moderate Perineum: well approximated/intact DVT Evaluation: LE non-ttp, no evidence of DVT on exam.  Hemoglobin  Date Value Ref Range Status  11/25/2018 10.2 (L) 12.0 - 15.0 g/dL Final   HCT  Date Value Ref Range Status  11/25/2018 31.3 (L) 36.0 - 46.0 % Final     Disposition: stable, discharge to home. Baby Feeding: breastmilkand formula Baby Disposition: home with mom  Rh Immune globulin given: n/a Rubella vaccine given: immune Varicella vaccine given: immune Tdap vaccine given in AP setting: given 10/05/18 Flu vaccine given in AP or PP setting: n/a  Contraception: Pills  Prenatal Labs:  Blood type/Rh --/--/O POS (07/27 1017)  Antibody screen neg  Rubella Immune  Varicella Immune  RPR NR  HBsAg Neg  HIV NR  GC neg  Chlamydia neg  Genetic screening negative  1 hour GTT 94  3 hour GTT n/a  GBS neg      Plan:  Sandy Sparks was discharged to home in good condition. Follow-up appointment with delivering provider in 6 weeks.  Discharge Medications: Allergies as of 11/26/2018   No Known Allergies     Medication List    STOP taking these  medications   aspirin EC 81 MG tablet     TAKE these medications   docusate sodium 100 MG capsule Commonly known as: Colace Take 1 capsule (100 mg total) by mouth 2 (two) times daily for 14 days. To keep stools soft, as needed   ferrous sulfate 325 (65 FE) MG tablet Take 650 mg by mouth every evening. What changed: Another medication with the same name was added. Make sure you understand how and when to take each.   ferrous sulfate 325 (65 FE) MG tablet Take 1 tablet (325 mg total) by mouth daily with breakfast. Take with Vitamin C What changed: You were already taking a medication with the same name, and this prescription was added. Make sure you understand how and when to take each.   ibuprofen 800 MG tablet Commonly known as: ADVIL Take 1 tablet (800 mg total) by mouth every 8 (eight) hours as needed for moderate pain or cramping.   labetalol 200 MG tablet Commonly known as: NORMODYNE Take 400 mg by mouth 2 (two) times daily.   prenatal multivitamin Tabs tablet Take 1 tablet by mouth every evening.   vitamin C 250 MG tablet Commonly known as: ASCORBIC ACID Take 250 mg by mouth every evening.       Follow-up Information    McVey, Prudencio PairRebecca A, CNM Follow up in 6 week(s).   Specialty: Obstetrics and Gynecology Why: postpartum visit Contact information: [address] [place] [id number] [phone]           Signed:  Christeen DouglasBethany Aubreyana Saltz, MD 11/26/2018  2:19 PM  Christeen DouglasBethany Danice Dippolito 11/26/18

## 2018-11-25 NOTE — Progress Notes (Signed)
Post Partum Day DOS Subjective: no complaints  Objective: Blood pressure 112/65, pulse 77, temperature 98.7 F (37.1 C), resp. rate 20, height 5\' 6"  (1.676 m), weight 129.7 kg, last menstrual period 03/06/2018, SpO2 99 %, unknown if currently breastfeeding.  Physical Exam:  General: alert and cooperative Lochia: appropriate Uterine Fundus: firm  DVT Evaluation: No evidence of DVT seen on physical exam.  Recent Labs    11/23/18 1017 11/25/18 0525  HGB 11.3* 10.2*  HCT 34.6* 31.3*    Assessment/Plan: Plan for discharge tomorrow   LOS: 2 days   Sandy Sparks 11/25/2018, 7:04 PM

## 2018-11-25 NOTE — Lactation Note (Signed)
This note was copied from a baby's chart. Lactation Consultation Note  Patient Name: Sandy Sparks Date: 11/25/2018 Reason for consult: Early term 37-38.6wks  LC provided mom with DEBP to assist with breast stimulation due to infant being late preterm at 59 weeks 5 days.   Showed mom how to assemble the pump, discussed how to clean the pump parts/pieces, and milk storage guidelines.  Provided mom with coconut oil to use to help prevent friction during pumping, and encouraged pumping every 2-3 hours to help stimulation hormones and milk production.  At time of demonstration and set up, LC offered to assist mom with first pumping session to ensure proper fit of breast shield; mom was not interested in pumping at that time, and had baby placed on her chest. Baby did have a shirt on, and was on top of mom's gown. Encouraged skin to skin by removing the baby's shirt, and placing baby under moms gown directly on her chest, mom declined.  Reviewed early hunger cues, newborn feeding patterns, benefits of skin to skin, and importance of breast stimulation through use of DEBP or hand expression.  [facility] name and number left on white board, encouraged to call out for assistance with pumping and/or breastfeeding.  Maternal Data Has patient been taught Hand Expression?: Yes Does the patient have breastfeeding experience prior to this delivery?: No  Feeding    LATCH Score                   Interventions Interventions: DEBP;Coconut oil  Lactation Tools Discussed/Used Pump Review: Setup, frequency, and cleaning;Milk Storage Initiated by:: Gerome Sam, MPH, IBCLC Date initiated:: 11/25/18   Consult Status Consult Status: Follow-up Date: 11/25/18 Follow-up type: In-patient    [name] 11/25/2018, 3:06 PM

## 2018-11-25 NOTE — Anesthesia Postprocedure Evaluation (Signed)
Anesthesia Post Note  Patient: Sandy Sparks  Procedure(s) Performed: external cephalic version (N/A Abdomen)  Patient location during evaluation: Mother Baby Anesthesia Type: Epidural Level of consciousness: awake and alert Pain management: pain level controlled Vital Signs Assessment: post-procedure vital signs reviewed and stable Respiratory status: spontaneous breathing, nonlabored ventilation and respiratory function stable Cardiovascular status: stable Postop Assessment: no headache, no backache and epidural receding Anesthetic complications: no     Last Vitals:  Vitals:   11/25/18 0749 11/25/18 0854  BP: (!) 147/81 120/76  Pulse: 91 88  Resp: 20   Temp: 37 C   SpO2: 98% 99%    Last Pain:  Vitals:   11/25/18 0749  TempSrc: Oral  PainSc:                  Ricki Miller

## 2018-11-25 NOTE — Lactation Note (Signed)
This note was copied from a baby's chart. Lactation Consultation Note  Patient Name: Sandy Sparks [id number] Date: 11/25/2018 Reason for consult: Initial assessment  LC spoke with parents this morning. Baby has had a few breastfeeding sessions with a nipple shield, but reporting that infant is appearing really sleepy in the last few hours.  Educated the parents on newborn feeding patterns, early hunger cues, wet/stool diapers in the first 24 hours.   Assisted with helping place the infant at the breast, on the left, in football hold. Had mom sit up in bed, supported her back with additional pillows.  Added pillows under her arm. Dad positioned the infant in football position with tummy turned towards mom.  Mom was able to put nipple shield on herself correctly. Infant did not rouse from sleeping, and did not give any hunger cues.   [facility] taught mom how to hand express on the opposite side, with a few drops of colostrum visible.  Due to infant being late preterm, LC will return to assist mom in the set up, use, and cleaning of DEBP for additional breast stimulation.  Maternal Data Has patient been taught Hand Expression?: Yes Does the patient have breastfeeding experience prior to this delivery?: No  Feeding Feeding Type: Breast Fed  LATCH Score                   Interventions Interventions: Breast feeding basics reviewed;Adjust position;Support pillows;Position options  Lactation Tools Discussed/Used     Consult Status Consult Status: Follow-up Date: 11/25/18 Follow-up type: In-patient    [name] 11/25/2018, 10:23 AM

## 2018-11-26 MED ORDER — FERROUS SULFATE 325 (65 FE) MG PO TABS: 325.0000 mg | ORAL_TABLET | Freq: Every day | ORAL | 1 refills | Status: DC

## 2018-11-26 MED ORDER — IBUPROFEN 800 MG PO TABS: 800.0000 mg | ORAL_TABLET | Freq: Three times a day (TID) | ORAL | 1 refills | Status: DC | PRN

## 2018-11-26 MED ORDER — DOCUSATE SODIUM 100 MG PO CAPS: 100.0000 mg | ORAL_CAPSULE | Freq: Two times a day (BID) | ORAL | 0 refills | Status: AC

## 2018-11-26 NOTE — Lactation Note (Signed)
This note was copied from a baby's chart. Lactation Consultation Note  Patient Name: Sandy Sparks [id number] Date: 11/26/2018 Reason for consult: Follow-up assessment   Maternal Data    Feeding Feeding Type: Bottle Fed - Formula Nipple Type: Slow - flow  LATCH Score                   Interventions  Discussed breast pumping if feeding the baby formula as well as breast milk.  Lactation Tools Discussed/Used     Consult Status      Daryel November 11/26/2018, 1:44 PM

## 2018-11-26 NOTE — Anesthesia Postprocedure Evaluation (Signed)
Anesthesia Post Note  Patient: Sandy Sparks  Procedure(s) Performed: AN AD [place]  Patient location during evaluation: Mother Baby Anesthesia Type: Epidural Level of consciousness: awake and alert Pain management: pain level controlled Vital Signs Assessment: post-procedure vital signs reviewed and stable Respiratory status: spontaneous breathing, nonlabored ventilation and respiratory function stable Cardiovascular status: stable Postop Assessment: no headache, no backache and epidural receding Anesthetic complications: no     Last Vitals:  Vitals:   11/25/18 2204 11/26/18 0406  BP: 140/84 131/71  Pulse: 84 90  Resp: 20 20  Temp: 36.7 C 36.5 C  SpO2: 100% 99%    Last Pain:  Vitals:   11/25/18 2204  TempSrc: Oral  PainSc:                  Brantley Fling

## 2018-11-26 NOTE — Plan of Care (Signed)
Vs stable; up ad lib; tolerating regular diet; taking motrin for pain control; breast and formula feeding now (see notes in baby's chart)

## 2018-11-26 NOTE — Progress Notes (Signed)
Patient discharged home with infant. Discharge instructions and prescriptions given and reviewed with patient. Patient verbalized understanding. Escorted out by staff.  

## 2018-11-27 LAB — SURGICAL PATHOLOGY

## 2019-01-12 DIAGNOSIS — I10 Essential (primary) hypertension: Secondary | ICD-10-CM | POA: Diagnosis not present

## 2019-01-12 DIAGNOSIS — Z1332 Encounter for screening for maternal depression: Secondary | ICD-10-CM | POA: Diagnosis not present

## 2022-09-19 ENCOUNTER — Telehealth: Payer: Self-pay | Admitting: General Practice

## 2022-09-19 DIAGNOSIS — O3680X0 Pregnancy with inconclusive fetal viability, not applicable or unspecified: Secondary | ICD-10-CM

## 2022-09-19 NOTE — Telephone Encounter (Signed)
Received call from [facility] referring patient for ultrasound. They state the patient's LMP is 3/20 and when she was seen in their office for ultrasound the gestational sac was measuring 8 weeks but they couldn't see a fetal pole. Scheduled ultrasound for 5/28 @ 8am. Called and informed patient. Patient verbalized understanding.

## 2022-09-24 ENCOUNTER — Ambulatory Visit (HOSPITAL_BASED_OUTPATIENT_CLINIC_OR_DEPARTMENT_OTHER)
Admission: RE | Admit: 2022-09-24 | Discharge: 2022-09-24 | Disposition: A | Discharge status: Home / Self Care | Payer: Medicaid Other | Source: Ambulatory Visit | Attending: Family Medicine | Admitting: Family Medicine

## 2022-09-24 DIAGNOSIS — O3680X0 Pregnancy with inconclusive fetal viability, not applicable or unspecified: Secondary | ICD-10-CM | POA: Diagnosis present

## 2022-09-26 ENCOUNTER — Telehealth: Payer: Self-pay

## 2022-09-26 DIAGNOSIS — O3680X0 Pregnancy with inconclusive fetal viability, not applicable or unspecified: Secondary | ICD-10-CM

## 2022-09-26 NOTE — Telephone Encounter (Addendum)
-----   Message from Reva Bores, MD sent at 09/24/2022  3:19 PM EDT ----- Needs f/u u/s in 14 days.  Left message that I am calling with f/u appt if she could please give the office a call back.    Leonette Nutting  09/26/22

## 2022-09-27 ENCOUNTER — Encounter (HOSPITAL_COMMUNITY): Payer: Self-pay | Admitting: *Deleted

## 2022-09-27 ENCOUNTER — Inpatient Hospital Stay (HOSPITAL_COMMUNITY): Payer: Medicaid Other

## 2022-09-27 ENCOUNTER — Inpatient Hospital Stay (HOSPITAL_COMMUNITY)
Admission: AD | Admit: 2022-09-27 | Discharge: 2022-09-28 | Disposition: A | Discharge status: Home / Self Care | Payer: Medicaid Other | Attending: Family Medicine | Admitting: Family Medicine

## 2022-09-27 DIAGNOSIS — O418X10 Other specified disorders of amniotic fluid and membranes, first trimester, not applicable or unspecified: Secondary | ICD-10-CM

## 2022-09-27 DIAGNOSIS — O10911 Unspecified pre-existing hypertension complicating pregnancy, first trimester: Secondary | ICD-10-CM | POA: Insufficient documentation

## 2022-09-27 DIAGNOSIS — Z3A10 10 weeks gestation of pregnancy: Secondary | ICD-10-CM | POA: Insufficient documentation

## 2022-09-27 DIAGNOSIS — O200 Threatened abortion: Secondary | ICD-10-CM | POA: Diagnosis not present

## 2022-09-27 DIAGNOSIS — O208 Other hemorrhage in early pregnancy: Secondary | ICD-10-CM | POA: Diagnosis present

## 2022-09-27 DIAGNOSIS — O09291 Supervision of pregnancy with other poor reproductive or obstetric history, first trimester: Secondary | ICD-10-CM | POA: Insufficient documentation

## 2022-09-27 DIAGNOSIS — O209 Hemorrhage in early pregnancy, unspecified: Secondary | ICD-10-CM

## 2022-09-27 DIAGNOSIS — O10919 Unspecified pre-existing hypertension complicating pregnancy, unspecified trimester: Secondary | ICD-10-CM

## 2022-09-27 DIAGNOSIS — Z79899 Other long term (current) drug therapy: Secondary | ICD-10-CM | POA: Diagnosis not present

## 2022-09-27 LAB — URINALYSIS, ROUTINE W REFLEX MICROSCOPIC
Bacteria, UA: NONE SEEN
Bilirubin Urine: NEGATIVE
Glucose, UA: NEGATIVE mg/dL
Ketones, ur: NEGATIVE mg/dL
Leukocytes,Ua: NEGATIVE
Nitrite: NEGATIVE
Protein, ur: NEGATIVE mg/dL
Specific Gravity, Urine: 1.006 (ref 1.005–1.030)
pH: 6 (ref 5.0–8.0)

## 2022-09-27 LAB — WET PREP, GENITAL
Sperm: NONE SEEN
Trich, Wet Prep: NONE SEEN
WBC, Wet Prep HPF POC: <10 (ref <10)
Yeast Wet Prep HPF POC: NONE SEEN

## 2022-09-27 LAB — HCG, QUANTITATIVE, PREGNANCY: hCG, Beta Chain, Quant, S: 25364 m[IU]/mL — ABNORMAL HIGH (ref <5)

## 2022-09-27 NOTE — MAU Provider Note (Signed)
  History     CSN: [id number]  Arrival date and time: 09/27/22 2116   Event Date/Time   First Provider Initiated Contact with Patient 09/27/22 2208      Chief Complaint  Patient presents with  . Vaginal Bleeding  . Abdominal Pain   HPI  {GYN/OB [id number]}  Past Medical History:  Diagnosis Date  . Medical history non-contributory     Past Surgical History:  Procedure Laterality Date  . CESAREAN SECTION N/A 11/23/2018   Procedure: external cephalic version;  Surgeon: Christeen Douglas, MD;  Location: [facility] ORS;  Service: Obstetrics;  Laterality: N/A;  . NO PAST SURGERIES      Family History  Problem Relation Age of Onset  . Aneurysm Mother        Brain  . Heart murmur Brother   . Aneurysm Maternal Grandmother   . Stroke Maternal Grandmother   . Diabetes Paternal Grandmother     Social History   Tobacco Use  . Smoking status: Never  . Smokeless tobacco: Never  Vaping Use  . Vaping Use: Never used  Substance Use Topics  . Alcohol use: Never  . Drug use: Never    Allergies: No Known Allergies  Medications Prior to Admission  Medication Sig Dispense Refill Last Dose  . ferrous sulfate 325 (65 FE) MG tablet Take 650 mg by mouth every evening.      . ferrous sulfate 325 (65 FE) MG tablet Take 1 tablet (325 mg total) by mouth daily with breakfast. Take with Vitamin C 30 tablet 1   . ibuprofen (ADVIL) 800 MG tablet Take 1 tablet (800 mg total) by mouth every 8 (eight) hours as needed for moderate pain or cramping. 30 tablet 1   . labetalol (NORMODYNE) 200 MG tablet Take 400 mg by mouth 2 (two) times daily.      . Prenatal Vit-Fe Fumarate-FA (PRENATAL MULTIVITAMIN) TABS tablet Take 1 tablet by mouth every evening.      . vitamin C (ASCORBIC ACID) 250 MG tablet Take 250 mg by mouth every evening.        Review of Systems Physical Exam   Blood pressure (!) 165/85, pulse (!) 104, temperature 97.9 F (36.6 C), temperature source Oral, resp. rate 16, height 5\' 6"   (1.676 m), weight 127 kg, last menstrual period 07/17/2022, unknown if currently breastfeeding.  Physical Exam  MAU Course  Procedures  MDM ***  Assessment and Plan  ***  Sandy Sparks 09/27/2022, 10:08 PM

## 2022-09-27 NOTE — MAU Note (Signed)
Pt says had U/S done at [facility] on 5-15- said 6 weeks Then went to [facility] on Tuesday- for an U/S - said 6 weeks and 2 days .  Then tonight at 7 pm- had pain in cervix-  Then pinkish  VB started at 830pm- when she wiped . Feels lower abd aching 1-2/10.

## 2022-09-27 NOTE — MAU Provider Note (Incomplete)
History     CSN: [id number]  Arrival date and time: 09/27/22 2116   Event Date/Time   First Provider Initiated Contact with Patient 09/27/22 2208      Chief Complaint  Patient presents with  . Vaginal Bleeding  . Abdominal Pain   [name] is a 27 y.o. year old G24P1001 female at [redacted]w[redacted]d weeks gestation who presents to MAU reporting she had an U/S done at [facility] on 09/11/2022 that said she was 8 weeks with no fetus. She went to [facility] on 09/24/2022 for a viability U/S; where she was 6.2 weeks with a small SCH. She reports that tonight at 1900 she started having intermittent of pain in her cervix "that came on suddenly;" rated 5.5/10. She reports that "it has gone away to just an ache now;" rated 1.5/10. She started having pinkish VB with wiping at 2030. She reports the she was told when she was younger that she had cHTN, but all she had to do was lose weight and that's what she did. She states that she has gained it all back and never followed up with that provider after that. She was taking Labetalol during her pregnancy in 2020, but she stopped taking it in 2021.   OB History     Gravida  2   Para  1   Term  1   Preterm      AB      Living  1      SAB      IAB      Ectopic      Multiple  0   Live Births  1           Past Medical History:  Diagnosis Date  . Medical history non-contributory     Past Surgical History:  Procedure Laterality Date  . CESAREAN SECTION N/A 11/23/2018   Procedure: external cephalic version;  Surgeon: Christeen Douglas, MD;  Location: [facility] ORS;  Service: Obstetrics;  Laterality: N/A;  . NO PAST SURGERIES      Family History  Problem Relation Age of Onset  . Aneurysm Mother        Brain  . Heart murmur Brother   . Aneurysm Maternal Grandmother   . Stroke Maternal Grandmother   . Diabetes Paternal Grandmother     Social History   Tobacco Use  . Smoking status: Never  . Smokeless tobacco: Never   Vaping Use  . Vaping Use: Never used  Substance Use Topics  . Alcohol use: Never  . Drug use: Never    Allergies: No Known Allergies  Medications Prior to Admission  Medication Sig Dispense Refill Last Dose  . ferrous sulfate 325 (65 FE) MG tablet Take 650 mg by mouth every evening.      . ferrous sulfate 325 (65 FE) MG tablet Take 1 tablet (325 mg total) by mouth daily with breakfast. Take with Vitamin C 30 tablet 1   . ibuprofen (ADVIL) 800 MG tablet Take 1 tablet (800 mg total) by mouth every 8 (eight) hours as needed for moderate pain or cramping. 30 tablet 1   . labetalol (NORMODYNE) 200 MG tablet Take 400 mg by mouth 2 (two) times daily.      . Prenatal Vit-Fe Fumarate-FA (PRENATAL MULTIVITAMIN) TABS tablet Take 1 tablet by mouth every evening.      . vitamin C (ASCORBIC ACID) 250 MG tablet Take 250 mg by mouth every evening.  Review of Systems  Constitutional: Negative.   HENT: Negative.    Eyes: Negative.   Respiratory: Negative.    Cardiovascular: Negative.   Gastrointestinal: Negative.   Endocrine: Negative.   Genitourinary:  Positive for pelvic pain ("aching now") and vaginal bleeding (pinkish VB with wiping).  Musculoskeletal: Negative.   Skin: Negative.   Allergic/Immunologic: Negative.   Neurological: Negative.   Hematological: Negative.   Psychiatric/Behavioral: Negative.     Physical Exam  Patient Vitals for the past 24 hrs:  BP Temp Temp src Pulse Resp Height Weight  09/27/22 2216 (!) 144/86 -- -- 90 -- -- --  09/27/22 2138 (!) 165/85 97.9 F (36.6 C) Oral (!) 104 16 5\' 6"  (1.676 m) 127 kg     Physical Exam Vitals and nursing note reviewed.  Constitutional:      Appearance: Normal appearance. She is obese.  Neurological:     Mental Status: She is alert and oriented to person, place, and time.  Psychiatric:        Mood and Affect: Mood normal.        Behavior: Behavior normal.        Thought Content: Thought content normal.    MAU Course   Procedures  MDM CCUA UPT CBC ABO/Rh HCG Wet Prep GC/CT -- pending HIV -- pending OB < 14 wks [place] with TV  Results for orders placed or performed during the hospital encounter of 09/27/22 (from the past 24 hour(s))  Wet prep, genital     Status: Abnormal   Collection Time: 09/27/22  9:44 PM  Result Value Ref Range   Yeast Wet Prep HPF POC NONE SEEN NONE SEEN   Trich, Wet Prep NONE SEEN NONE SEEN   Clue Cells Wet Prep HPF POC PRESENT (A) NONE SEEN   WBC, Wet Prep HPF POC <10 <10   Sperm NONE SEEN   Urinalysis, Routine w reflex microscopic -Urine, Clean Catch     Status: Abnormal   Collection Time: 09/27/22  9:44 PM  Result Value Ref Range   Color, Urine STRAW (A) YELLOW   APPearance CLEAR CLEAR   Specific Gravity, Urine 1.006 1.005 - 1.030   pH 6.0 5.0 - 8.0   Glucose, UA NEGATIVE NEGATIVE mg/dL   Hgb urine dipstick SMALL (A) NEGATIVE   Bilirubin Urine NEGATIVE NEGATIVE   Ketones, ur NEGATIVE NEGATIVE mg/dL   Protein, ur NEGATIVE NEGATIVE mg/dL   Nitrite NEGATIVE NEGATIVE   Leukocytes,Ua NEGATIVE NEGATIVE   RBC / HPF 0-5 0 - 5 RBC/hpf   WBC, UA 0-5 0 - 5 WBC/hpf   Bacteria, UA NONE SEEN NONE SEEN   Squamous Epithelial / HPF 0-5 0 - 5 /HPF   Mucus PRESENT     No results found.   Assessment and Plan  ***  Raelyn Mora, CNM 09/27/2022, 10:08 PM

## 2022-09-28 DIAGNOSIS — O200 Threatened abortion: Secondary | ICD-10-CM

## 2022-09-28 DIAGNOSIS — O209 Hemorrhage in early pregnancy, unspecified: Secondary | ICD-10-CM

## 2022-09-28 DIAGNOSIS — O418X10 Other specified disorders of amniotic fluid and membranes, first trimester, not applicable or unspecified: Secondary | ICD-10-CM

## 2022-09-28 LAB — HIV ANTIBODY (ROUTINE TESTING W REFLEX): HIV Screen 4th Generation wRfx: NONREACTIVE

## 2022-09-28 MED ORDER — NIFEDIPINE ER OSMOTIC RELEASE 30 MG PO TB24: 30.0000 mg | ORAL_TABLET | Freq: Every day | ORAL | 3 refills | Status: DC

## 2022-09-28 NOTE — Discharge Instructions (Signed)
Return to MAU: °If you have heavier bleeding that soaks through more that 2 pads per hour for an hour or more °If you bleed so much that you feel like you might pass out or you do pass out °If you have significant abdominal pain that is not improved with Tylenol 1000 mg every 8 hours as needed for pain °If you develop a fever > 100.5 °

## 2022-09-30 LAB — GC/CHLAMYDIA PROBE AMP (~~LOC~~) NOT AT ARMC
Chlamydia: NEGATIVE
Comment: NEGATIVE
Comment: NORMAL
Neisseria Gonorrhea: NEGATIVE

## 2022-10-02 NOTE — Telephone Encounter (Signed)
Pt notified of provider's recommendation.  Pt agreed to U/S at [facility] on 10/11/22 @ 0930.    Leonette Nutting  10/02/22

## 2022-10-09 ENCOUNTER — Inpatient Hospital Stay (HOSPITAL_COMMUNITY)
Admission: AD | Admit: 2022-10-09 | Discharge: 2022-10-10 | Disposition: A | Discharge status: Home / Self Care | Payer: Medicaid Other | Attending: Obstetrics and Gynecology | Admitting: Obstetrics and Gynecology

## 2022-10-09 ENCOUNTER — Inpatient Hospital Stay (HOSPITAL_COMMUNITY): Payer: Medicaid Other

## 2022-10-09 DIAGNOSIS — O021 Missed abortion: Secondary | ICD-10-CM | POA: Insufficient documentation

## 2022-10-09 DIAGNOSIS — Z3A12 12 weeks gestation of pregnancy: Secondary | ICD-10-CM | POA: Diagnosis not present

## 2022-10-09 NOTE — MAU Note (Signed)
..  Sandy Sparks is a 27 y.o. at [redacted]w[redacted]d here in MAU reporting: abdominal cramping that began this morning, took tylenol around 3 pm (1,000 mg), that helped with the pain.  Vaginal bleeding with clots that got heavier 3 days ago, none on the pad but on the tissue when she wipes. Was here in MAU 09/27/2022 and has had light bleeding since then.  Has CHTN and is on BP medication labetalol, took dose this morning  Pain score: 2/10 Vitals:   10/09/22 2310  BP: (!) 145/92  Pulse: (!) 111  Resp: 17  Temp: 98.6 F (37 C)  SpO2: 100%     [id number] had [place] Lab orders placed from triage:  UA

## 2022-10-09 NOTE — MAU Provider Note (Signed)
Chief Complaint: Abdominal Pain and Vaginal Bleeding   Event Date/Time   First Provider Initiated Contact with Patient 10/09/22 2357        SUBJECTIVE HPI: Leahann Witherington is a 27 y.o. G2P1001 at [redacted]w[redacted]d by LMP who presents to maternity admissions reporting pelvic cramping and vaginal bleeding with passage of clots.  Bleeding is not heavy.  . She denies urinary symptoms, h/a, dizziness, n/v, or fever/chills.   Was seen 2 weeks ago for similar symptoms Had ultrasounds done twice on 5/28 and 5/31 which showed intrauterine gestational sac with  no yolk sac or embryo on either study.  Abdominal Pain This is a recurrent problem. The current episode started 1 to 4 weeks ago. The abdominal pain does not radiate. Pertinent negatives include no constipation, diarrhea, dysuria, fever, frequency, myalgias, nausea or vomiting. Nothing aggravates the pain. The pain is relieved by Nothing. She has tried nothing for the symptoms.  Vaginal Bleeding The patient's primary symptoms include pelvic pain and vaginal bleeding. The patient's pertinent negatives include no genital itching. She is pregnant. Associated symptoms include abdominal pain. Pertinent negatives include no constipation, diarrhea, dysuria, fever, frequency, nausea or vomiting. The vaginal discharge was bloody. She has been passing clots. Nothing aggravates the symptoms. She has tried nothing for the symptoms.   RN Note: .Corianna Hiestand is a 27 y.o. at [redacted]w[redacted]d here in MAU reporting: abdominal cramping that began this morning, took tylenol around 3 pm (1,000 mg), that helped with the pain.  Vaginal bleeding with clots that got heavier 3 days ago, none on the pad but on the tissue when she wipes. Was here in MAU 09/27/2022 and has had light bleeding since then.  Has CHTN and is on BP medication labetalol, took dose this morning  Pain score: 2/10  Past Medical History:  Diagnosis Date   Antepartum hypertension 10/26/2018   On labetalol 400 mg bid has  cuff at home keeps a log generally less than 145/95  Her arm size is between adult and large adult    Breech presentation 10/26/2018   At 34 weeks   Indication for care in labor and delivery, antepartum 11/02/2018   Medical history non-contributory    Pregnancy affected by fetal growth restriction 10/26/2018   8th percentile at 34 weeks,  AC at the third %   Past Surgical History:  Procedure Laterality Date   CESAREAN SECTION N/A 11/23/2018   Procedure: external cephalic version;  Surgeon: Christeen Douglas, MD;  Location: [facility] ORS;  Service: Obstetrics;  Laterality: N/A;   NO PAST SURGERIES     Social History   Socioeconomic History   Marital status: Married    Spouse name: Clifton Custard   Number of children: Not on file   Years of education: Not on file   Highest education level: Not on file  Occupational History   Not on file  Tobacco Use   Smoking status: Never   Smokeless tobacco: Never  Vaping Use   Vaping Use: Never used  Substance and Sexual Activity   Alcohol use: Never   Drug use: Never   Sexual activity: Yes    Birth control/protection: Pill  Other Topics Concern   Not on file  Social History Narrative   Not on file   Social Determinants of Health   Financial Resource Strain: Low Risk  (11/02/2018)   Overall Financial Resource Strain (CARDIA)    Difficulty of Paying Living Expenses: Not hard at all  Food Insecurity: No Food Insecurity (11/02/2018)   Hunger  Vital Sign    Worried About Programme researcher, broadcasting/film/video in the Last Year: Never true    Ran Out of Food in the Last Year: Never true  Transportation Needs: No Transportation Needs (11/02/2018)   PRAPARE - Administrator, Civil Service (Medical): No    Lack of Transportation (Non-Medical): No  Physical Activity: Insufficiently Active (11/02/2018)   Exercise Vital Sign    Days of Exercise per Week: 1 day    Minutes of Exercise per Session: 10 min  Stress: No Stress Concern Present (11/02/2018)   [employer] of  Occupational Health - Occupational Stress Questionnaire    Feeling of Stress : Not at all  Social Connections: Moderately Integrated (11/02/2018)   Social Connection and Isolation Panel [NHANES]    Frequency of Communication with Friends and Family: Three times a week    Frequency of Social Gatherings with Friends and Family: Twice a week    Attends Religious Services: 1 to 4 times per year    Active Member of [employer] or Organizations: No    Attends Banker Meetings: Never    Marital Status: Married  Catering manager Violence: Not At Risk (11/02/2018)   Humiliation, Afraid, Rape, and Kick questionnaire    Fear of Current or Ex-Partner: No    Emotionally Abused: No    Physically Abused: No    Sexually Abused: No   No current facility-administered medications on file prior to encounter.   Current Outpatient Medications on File Prior to Encounter  Medication Sig Dispense Refill   labetalol (NORMODYNE) 200 MG tablet Take 400 mg by mouth 2 (two) times daily.      NIFEdipine (PROCARDIA XL) 30 MG 24 hr tablet Take 1 tablet (30 mg total) by mouth daily. 30 tablet 3   Prenatal Vit-Fe Fumarate-FA (PRENATAL MULTIVITAMIN) TABS tablet Take 1 tablet by mouth every evening.      vitamin C (ASCORBIC ACID) 250 MG tablet Take 250 mg by mouth every evening.      No Known Allergies  I have reviewed patient's Past Medical Hx, Surgical Hx, Family Hx, Social Hx, medications and allergies.   ROS:  Review of Systems  Constitutional:  Negative for fever.  Gastrointestinal:  Positive for abdominal pain. Negative for constipation, diarrhea, nausea and vomiting.  Genitourinary:  Positive for pelvic pain and vaginal bleeding. Negative for dysuria and frequency.  Musculoskeletal:  Negative for myalgias.   Review of Systems  Other systems negative   Physical Exam  Physical Exam Patient Vitals for the past 24 hrs:  BP Temp Temp src Pulse Resp SpO2 Height Weight  10/09/22 2310 (!) 145/92 98.6 F  (37 C) Oral (!) 111 17 100 % 5\' 6"  (1.676 m) 128.1 kg   Constitutional: Well-developed, well-nourished female in no acute distress.  Cardiovascular: normal rate Respiratory: normal effort GI: Abd soft, non-tender. Pos BS x 4 MS: Extremities nontender, no edema, normal ROM Neurologic: Alert and oriented x 4.  GU: Neg CVAT.  PELVIC EXAM: deferred in lieu of transvaginal ultrasound  LAB RESULTS Results for orders placed or performed during the hospital encounter of 10/09/22 (from the past 24 hour(s))  Urinalysis, Routine w reflex microscopic -Urine, Clean Catch     Status: Abnormal   Collection Time: 10/09/22 11:51 PM  Result Value Ref Range   Color, Urine YELLOW YELLOW   APPearance CLOUDY (A) CLEAR   Specific Gravity, Urine 1.019 1.005 - 1.030   pH 5.0 5.0 - 8.0   Glucose, UA  NEGATIVE NEGATIVE mg/dL   Hgb urine dipstick LARGE (A) NEGATIVE   Bilirubin Urine NEGATIVE NEGATIVE   Ketones, ur NEGATIVE NEGATIVE mg/dL   Protein, ur 30 (A) NEGATIVE mg/dL   Nitrite NEGATIVE NEGATIVE   Leukocytes,Ua TRACE (A) NEGATIVE   RBC / HPF >50 0 - 5 RBC/hpf   WBC, UA 0-5 0 - 5 WBC/hpf   Bacteria, UA NONE SEEN NONE SEEN   Squamous Epithelial / HPF 11-20 0 - 5 /HPF   Mucus PRESENT      IMAGING US OB Transvaginal  Result Date: 10/09/2022 CLINICAL DATA:  Cramping with threatened spontaneous abortion. EXAM: TRANSVAGINAL OB ULTRASOUND TECHNIQUE: Transvaginal ultrasound was performed for complete evaluation of the gestation as well as the maternal uterus, adnexal regions, and pelvic cul-de-sac. COMPARISON:  Sep 09, 2022 FINDINGS: Intrauterine gestational sac: Single Yolk sac:  Not Visualized. Embryo:  Not Visualized. Cardiac Activity: Not Visualized. Heart Rate: N/A bpm MSD: 15.4 mm   6 w   2 d Subchorionic hemorrhage:  None visualized. Maternal uterus/adnexae: The bilateral ovaries are not clearly visualized. IMPRESSION: Findings are suspicious but not yet definitive for failed pregnancy. Recommend  follow-up US in 10-14 days for definitive diagnosis. This recommendation follows SRU consensus guidelines: Diagnostic Criteria for Nonviable Pregnancy Early in the First Trimester. Malva Limes Med 2013; 161:0960-45. Electronically Signed   By: Aram Candela M.D.   On: 10/09/2022 22:50   US OB Transvaginal  Result Date: 09/27/2022 CLINICAL DATA:  Subchorionic hemorrhage EXAM: TRANSVAGINAL OB ULTRASOUND TECHNIQUE: Transvaginal ultrasound was performed for complete evaluation of the gestation as well as the maternal uterus, adnexal regions, and pelvic cul-de-sac. COMPARISON:  Obstetrical ultrasound 09/16/2022 FINDINGS: Intrauterine gestational sac: Single Yolk sac:  Not Visualized. Embryo:  Not Visualized. MSD: 15.9 mm   6 w   3 d Subchorionic hemorrhage: There is a posterior subchorionic hemorrhage measuring 5 x 15 mm on sagittal imaging. This is slightly increased in size. Maternal uterus/adnexae: The right ovary appears within normal limits. The left is not definitively visualized. There is trace free fluid in the pelvis. IMPRESSION: 1. Probable early intrauterine gestational sac, but no yolk sac, fetal pole, or cardiac activity yet visualized. Recommend follow-up quantitative B-HCG levels and follow-up US in 14 days to assess viability. This recommendation follows SRU consensus guidelines: Diagnostic Criteria for Nonviable Pregnancy Early in the First Trimester. Malva Limes Med 2013; 409:8119-14. 2. Small to moderate-sized subchorionic hemorrhage has increased in size. Electronically Signed   By: Darliss Cheney M.D.   On: 09/27/2022 23:57   US OB LESS THAN 14 WEEKS WITH OB TRANSVAGINAL  Result Date: 09/24/2022 CLINICAL DATA:  Viability scan EXAM: OBSTETRIC <14 WK [place] AND TRANSVAGINAL OB US TECHNIQUE: Both transabdominal and transvaginal ultrasound examinations were performed for complete evaluation of the gestation as well as the maternal uterus, adnexal regions, and pelvic cul-de-sac. Transvaginal technique  was performed to assess early pregnancy. COMPARISON:  None Available. FINDINGS: Intrauterine gestational sac: Single Yolk sac:  Not Visualized. Embryo:  Not Visualized. Cardiac Activity: Not Visualized. MSD: 14.2 mm   6 w   2 d Subchorionic hemorrhage:  Small Maternal uterus/adnexae: Normal right and left ovaries. No free fluid in the pelvis. IMPRESSION: Probable early intrauterine gestational sac, but no yolk sac, fetal pole, or cardiac activity yet visualized. Recommend follow-up quantitative B-HCG levels and follow-up US in 14 days to assess viability. This recommendation follows SRU consensus guidelines: Diagnostic Criteria for Nonviable Pregnancy Early in the First Trimester. N Engl J Med 2013;  604:5409-81. Electronically Signed   By: Annia Belt M.D.   On: 09/24/2022 09:13    MAU Management/MDM: I have reviewed the triage vital signs and the nursing notes.   Pertinent labs & imaging results that were available during my care of the patient were reviewed by me and considered in my medical decision making (see chart for details).      I have reviewed her medical records including past results, notes and treatments. Medical, Surgical, and family history were reviewed.  Medications and recent lab tests were reviewed  Ordered Ultrasound to assess for progression Tonight's ultrasound is nearly identical to the last one in May Radiologist states is not confirmatory but I consulted Dr Alysia Penna with presentation, exam findings, and results.  He agrees that this is a confirmed missed abortion/failed pregnancy.  Discussed with patient and her partner.  Offered expectant management, cytotec induction and D&E.  She wished to proceed with Cytotec  ASSESSMENT Single IUP at [redacted]w[redacted]d by LMP Failed pregnancy, Missed abortion  PLAN Discharge home Rx Cytotec for missed abortion management Rx Percocet for pain Rx Phenergan for nausea Bleeding precautions Message sent to schedule post miscarriage followup visit Pt  stable at time of discharge. Encouraged to return here if she develops worsening of symptoms, increase in pain, fever, or other concerning symptoms.    Wynelle Bourgeois CNM, MSN Certified Nurse-Midwife 10/09/2022  11:57 PM

## 2022-10-10 ENCOUNTER — Telehealth: Payer: Self-pay | Admitting: Advanced Practice Midwife

## 2022-10-10 DIAGNOSIS — Z3A12 12 weeks gestation of pregnancy: Secondary | ICD-10-CM | POA: Diagnosis not present

## 2022-10-10 DIAGNOSIS — O021 Missed abortion: Secondary | ICD-10-CM | POA: Diagnosis not present

## 2022-10-10 LAB — URINALYSIS, ROUTINE W REFLEX MICROSCOPIC
Bacteria, UA: NONE SEEN
Bilirubin Urine: NEGATIVE
Glucose, UA: NEGATIVE mg/dL
Ketones, ur: NEGATIVE mg/dL
Nitrite: NEGATIVE
Protein, ur: 30 mg/dL — AB
RBC / HPF: >50 RBC/hpf (ref 0–5)
Specific Gravity, Urine: 1.019 (ref 1.005–1.030)
pH: 5 (ref 5.0–8.0)

## 2022-10-10 MED ORDER — MISOPROSTOL 200 MCG PO TABS: ORAL_TABLET | ORAL | 1 refills | Status: DC

## 2022-10-10 MED ORDER — PROMETHAZINE HCL 25 MG PO TABS: 25.0000 mg | ORAL_TABLET | Freq: Four times a day (QID) | ORAL | 2 refills | Status: DC | PRN

## 2022-10-10 MED ORDER — OXYCODONE-ACETAMINOPHEN 5-325 MG PO TABS: 1.0000 | ORAL_TABLET | Freq: Four times a day (QID) | ORAL | 0 refills | Status: DC | PRN

## 2022-10-10 NOTE — Discharge Instructions (Signed)

## 2022-10-10 NOTE — Telephone Encounter (Signed)
Spoke to patient about scheduling an appointment. Asked if she was okay coming to [facility], or did she want to go to [facility] location. She stated she would rather be seen at the [facility] office.

## 2022-10-11 ENCOUNTER — Ambulatory Visit (HOSPITAL_COMMUNITY): Admission: RE | Admit: 2022-10-11 | Payer: Medicaid Other | Source: Ambulatory Visit

## 2022-10-11 ENCOUNTER — Encounter (HOSPITAL_COMMUNITY): Payer: Self-pay

## 2022-10-29 ENCOUNTER — Ambulatory Visit (HOSPITAL_BASED_OUTPATIENT_CLINIC_OR_DEPARTMENT_OTHER): Payer: Medicaid Other | Admitting: Obstetrics & Gynecology

## 2022-10-30 ENCOUNTER — Other Ambulatory Visit (HOSPITAL_COMMUNITY)
Admission: RE | Admit: 2022-10-30 | Discharge: 2022-10-30 | Disposition: A | Discharge status: Home / Self Care | Payer: Medicaid Other | Source: Ambulatory Visit | Attending: Obstetrics & Gynecology | Admitting: Obstetrics & Gynecology

## 2022-10-30 ENCOUNTER — Encounter (HOSPITAL_BASED_OUTPATIENT_CLINIC_OR_DEPARTMENT_OTHER): Payer: Self-pay | Admitting: Obstetrics & Gynecology

## 2022-10-30 ENCOUNTER — Ambulatory Visit (INDEPENDENT_AMBULATORY_CARE_PROVIDER_SITE_OTHER): Payer: Medicaid Other | Admitting: Obstetrics & Gynecology

## 2022-10-30 VITALS — BP 140/88 | HR 102 | Ht 66.0 in | Wt 284.8 lb

## 2022-10-30 DIAGNOSIS — Z124 Encounter for screening for malignant neoplasm of cervix: Secondary | ICD-10-CM | POA: Diagnosis not present

## 2022-10-30 DIAGNOSIS — O039 Complete or unspecified spontaneous abortion without complication: Secondary | ICD-10-CM

## 2022-10-30 DIAGNOSIS — Z6841 Body Mass Index (BMI) 40.0 and over, adult: Secondary | ICD-10-CM | POA: Insufficient documentation

## 2022-10-30 DIAGNOSIS — Z3A00 Weeks of gestation of pregnancy not specified: Secondary | ICD-10-CM

## 2022-10-30 DIAGNOSIS — I10 Essential (primary) hypertension: Secondary | ICD-10-CM

## 2022-10-30 DIAGNOSIS — Z30011 Encounter for initial prescription of contraceptive pills: Secondary | ICD-10-CM

## 2022-10-30 MED ORDER — NORETHINDRONE 0.35 MG PO TABS
1.0000 | ORAL_TABLET | Freq: Every day | ORAL | 3 refills | Status: AC
Start: 2022-10-30 — End: ?

## 2022-10-30 MED ORDER — NIFEDIPINE ER 60 MG PO TB24
60.0000 mg | ORAL_TABLET | Freq: Every day | ORAL | 1 refills | Status: DC
Start: 2022-10-30 — End: 2022-11-20

## 2022-10-30 NOTE — Progress Notes (Signed)
GYNECOLOGY  VISIT  CC:   follow up after miscarriage  HPI: 27 y.o. G41P1011 Married Other or two or more races female here for after miscarriage that started on 6/9.  She was seen in the MAU on 6/12 when she reports the bleeding was the heaviest.  It lasted for about 9 or 10 days all total and then was just pinkish.  She is not bleeding today.  She was given misoprostol when in the MAU and she reports this made her bleeding heavier.  Last HCG level was 25,364.  MBT O+.    She is interested in being on contraception.  Has been on pills in the past and would like to restart now.  POPs with hypertension discussed.  Pt aware if misses or takes pill late, she does need to use back up method for 7 days.  She has been working on weight loss and would like to continue before trying again for pregnancy.  Was surprised by her most recent pregnancy.   Past Medical History:  Diagnosis Date   Antepartum hypertension 10/26/2018   On labetalol 400 mg bid has cuff at home keeps a log generally less than 145/95  Her arm size is between adult and large adult    Breech presentation 10/26/2018   At 34 weeks   Pregnancy affected by fetal growth restriction 10/26/2018   8th percentile at 34 weeks,  AC at the third %    MEDS:   Current Outpatient Medications on File Prior to Visit  Medication Sig Dispense Refill   NIFEdipine (PROCARDIA XL) 30 MG 24 hr tablet Take 1 tablet (30 mg total) by mouth daily. 30 tablet 3   No current facility-administered medications on file prior to visit.    ALLERGIES: Patient has no known allergies.  SH:  married, non smoker  Review of Systems  Genitourinary: Negative.     PHYSICAL EXAMINATION:    BP (!) 142/87   Pulse (!) 102   Ht 5\' 6"  (1.676 m)   Wt 284 lb 12.8 oz (129.2 kg)   LMP 07/17/2022   BMI 45.97 kg/m     General appearance: alert, cooperative and appears stated age CV:  Regular rate and rhythm Lungs:  clear to auscultation, no wheezes, rales or rhonchi,  symmetric air entry Abdomen: soft, non-tender; bowel sounds normal; no masses,  no organomegaly Lymph:  no inguinal LAD noted  Pelvic: External genitalia:  no lesions              Urethra:  normal appearing urethra with no masses, tenderness or lesions              Bartholins and Skenes: normal                 Vagina: normal appearing vagina with normal color and discharge, no lesions              Cervix: no lesions              Bimanual Exam:  Uterus:  normal size, contour, position, consistency, mobility, non-tender              Adnexa: no mass, fullness, tenderness  Chaperone, Raechel Ache, RN, was present for exam.  Assessment/Plan: 1. Miscarriage - pt aware with follow HCG levels all the way to normal - Beta hCG quant (ref lab)  2. Primary hypertension - will increase dosing to 60mg  daily - NIFEdipine (ADALAT CC) 60 MG 24 hr tablet; Take 1 tablet (60  mg total) by mouth daily.  Dispense: 90 tablet; Refill: 1  3. Encounter for initial prescription of contraceptive pills - norethindrone (MICRONOR) 0.35 MG tablet; Take 1 tablet (0.35 mg total) by mouth daily.  Dispense: 84 tablet; Refill: 3  4. Cervical cancer screening - Cytology - PAP( [place])

## 2022-10-31 LAB — BETA HCG QUANT (REF LAB): hCG Quant: 27 m[IU]/mL

## 2022-11-01 NOTE — Addendum Note (Signed)
Addended by: Jerene Bears on: 11/01/2022 02:53 PM   Modules accepted: Orders

## 2022-11-04 LAB — CYTOLOGY - PAP: Diagnosis: NEGATIVE

## 2022-11-08 ENCOUNTER — Encounter (HOSPITAL_BASED_OUTPATIENT_CLINIC_OR_DEPARTMENT_OTHER): Payer: Self-pay | Admitting: Obstetrics & Gynecology

## 2022-11-12 ENCOUNTER — Other Ambulatory Visit (HOSPITAL_BASED_OUTPATIENT_CLINIC_OR_DEPARTMENT_OTHER): Payer: Medicaid Other

## 2022-11-12 DIAGNOSIS — O039 Complete or unspecified spontaneous abortion without complication: Secondary | ICD-10-CM

## 2022-11-13 LAB — BETA HCG QUANT (REF LAB): hCG Quant: 7 m[IU]/mL

## 2022-11-20 ENCOUNTER — Other Ambulatory Visit (HOSPITAL_BASED_OUTPATIENT_CLINIC_OR_DEPARTMENT_OTHER): Payer: Self-pay | Admitting: *Deleted

## 2022-11-20 ENCOUNTER — Other Ambulatory Visit (HOSPITAL_BASED_OUTPATIENT_CLINIC_OR_DEPARTMENT_OTHER): Payer: Self-pay | Admitting: Obstetrics & Gynecology

## 2022-11-20 MED ORDER — LABETALOL HCL 100 MG PO TABS: 200.0000 mg | ORAL_TABLET | Freq: Two times a day (BID) | ORAL | 1 refills | Status: DC

## 2022-11-20 NOTE — Telephone Encounter (Signed)
Called pt in response to [employer]. Advised that she should stop the nifedipine and Dr. Hyacinth Meeker will switch her to labetalol 200mg  twice daily. Rx sent to pharmacy.

## 2022-11-21 ENCOUNTER — Other Ambulatory Visit (HOSPITAL_BASED_OUTPATIENT_CLINIC_OR_DEPARTMENT_OTHER): Payer: Medicaid Other

## 2022-11-21 DIAGNOSIS — O039 Complete or unspecified spontaneous abortion without complication: Secondary | ICD-10-CM

## 2023-01-25 ENCOUNTER — Other Ambulatory Visit (HOSPITAL_BASED_OUTPATIENT_CLINIC_OR_DEPARTMENT_OTHER): Payer: Self-pay | Admitting: Obstetrics & Gynecology
